# Patient Record
Sex: Female | Born: 1940
Health system: Southern US, Community
[De-identification: ages and names within clinical notes are randomized; demographics above are authoritative.]

## PROBLEM LIST (undated history)

## (undated) DIAGNOSIS — M1990 Unspecified osteoarthritis, unspecified site: Secondary | ICD-10-CM

## (undated) DIAGNOSIS — I4891 Unspecified atrial fibrillation: Secondary | ICD-10-CM

## (undated) DIAGNOSIS — F4310 Post-traumatic stress disorder, unspecified: Secondary | ICD-10-CM

## (undated) DIAGNOSIS — I10 Essential (primary) hypertension: Secondary | ICD-10-CM

## (undated) DIAGNOSIS — E7800 Pure hypercholesterolemia, unspecified: Secondary | ICD-10-CM

## (undated) DIAGNOSIS — E039 Hypothyroidism, unspecified: Secondary | ICD-10-CM

## (undated) HISTORY — DX: Hypothyroidism, unspecified: E03.9

## (undated) HISTORY — PX: ABDOMINAL HYSTERECTOMY: SHX81

## (undated) HISTORY — PX: CARPAL TUNNEL RELEASE: SHX101

## (undated) HISTORY — DX: Post-traumatic stress disorder, unspecified: F43.10

## (undated) HISTORY — DX: Pure hypercholesterolemia, unspecified: E78.00

## (undated) HISTORY — PX: OTHER SURGICAL HISTORY: SHX169

## (undated) HISTORY — DX: Essential (primary) hypertension: I10

## (undated) HISTORY — DX: Unspecified osteoarthritis, unspecified site: M19.90

## (undated) HISTORY — DX: Unspecified atrial fibrillation: I48.91

---

## 2001-07-16 ENCOUNTER — Ambulatory Visit (HOSPITAL_COMMUNITY): Admission: RE | Admit: 2001-07-16 | Discharge: 2001-07-16 | Payer: Self-pay | Admitting: Pulmonary Disease

## 2001-09-20 ENCOUNTER — Ambulatory Visit (HOSPITAL_COMMUNITY): Admission: RE | Admit: 2001-09-20 | Discharge: 2001-09-20 | Payer: Self-pay | Admitting: Pulmonary Disease

## 2001-11-12 ENCOUNTER — Ambulatory Visit (HOSPITAL_COMMUNITY): Admission: RE | Admit: 2001-11-12 | Discharge: 2001-11-12 | Payer: Self-pay | Admitting: Orthopaedic Surgery

## 2001-12-05 ENCOUNTER — Ambulatory Visit (HOSPITAL_COMMUNITY): Admission: RE | Admit: 2001-12-05 | Discharge: 2001-12-05 | Payer: Self-pay | Admitting: Pulmonary Disease

## 2002-12-29 ENCOUNTER — Ambulatory Visit (HOSPITAL_COMMUNITY): Admission: RE | Admit: 2002-12-29 | Discharge: 2002-12-29 | Payer: Self-pay | Admitting: Pulmonary Disease

## 2004-01-05 ENCOUNTER — Ambulatory Visit (HOSPITAL_COMMUNITY): Admission: RE | Admit: 2004-01-05 | Discharge: 2004-01-05 | Payer: Self-pay | Admitting: Pulmonary Disease

## 2005-04-27 ENCOUNTER — Ambulatory Visit (HOSPITAL_COMMUNITY): Admission: RE | Admit: 2005-04-27 | Discharge: 2005-04-27 | Payer: Self-pay | Admitting: Pulmonary Disease

## 2005-05-22 ENCOUNTER — Ambulatory Visit: Payer: Self-pay | Admitting: *Deleted

## 2005-05-23 ENCOUNTER — Ambulatory Visit: Payer: Self-pay | Admitting: Cardiology

## 2005-05-23 ENCOUNTER — Ambulatory Visit (HOSPITAL_COMMUNITY): Admission: RE | Admit: 2005-05-23 | Discharge: 2005-05-24 | Payer: Self-pay | Admitting: *Deleted

## 2005-05-25 ENCOUNTER — Ambulatory Visit: Payer: Self-pay | Admitting: *Deleted

## 2005-06-01 ENCOUNTER — Ambulatory Visit: Payer: Self-pay | Admitting: *Deleted

## 2006-10-23 ENCOUNTER — Ambulatory Visit (HOSPITAL_COMMUNITY): Admission: RE | Admit: 2006-10-23 | Discharge: 2006-10-23 | Payer: Self-pay | Admitting: Pulmonary Disease

## 2007-11-11 ENCOUNTER — Ambulatory Visit (HOSPITAL_COMMUNITY): Admission: RE | Admit: 2007-11-11 | Discharge: 2007-11-11 | Payer: Self-pay | Admitting: Pulmonary Disease

## 2007-11-20 ENCOUNTER — Ambulatory Visit (HOSPITAL_COMMUNITY): Admission: RE | Admit: 2007-11-20 | Discharge: 2007-11-20 | Payer: Self-pay | Admitting: Pulmonary Disease

## 2008-06-15 ENCOUNTER — Ambulatory Visit (HOSPITAL_COMMUNITY): Admission: RE | Admit: 2008-06-15 | Discharge: 2008-06-15 | Payer: Self-pay | Admitting: Pulmonary Disease

## 2008-11-12 ENCOUNTER — Ambulatory Visit (HOSPITAL_COMMUNITY): Admission: RE | Admit: 2008-11-12 | Discharge: 2008-11-12 | Payer: Self-pay | Admitting: Pulmonary Disease

## 2009-11-15 ENCOUNTER — Ambulatory Visit (HOSPITAL_COMMUNITY): Admission: RE | Admit: 2009-11-15 | Discharge: 2009-11-15 | Payer: Self-pay | Admitting: Pulmonary Disease

## 2010-07-14 ENCOUNTER — Ambulatory Visit (HOSPITAL_COMMUNITY)
Admission: RE | Admit: 2010-07-14 | Discharge: 2010-07-14 | Payer: Self-pay | Source: Home / Self Care | Admitting: Pulmonary Disease

## 2010-10-19 LAB — BASIC METABOLIC PANEL
BUN: 14 mg/dL (ref 6–23)
CO2: 28 mEq/L (ref 19–32)
Calcium: 9.3 mg/dL (ref 8.4–10.5)
Chloride: 108 mEq/L (ref 96–112)
Creatinine, Ser: 0.87 mg/dL (ref 0.4–1.2)
GFR calc Af Amer: >60 mL/min (ref >60)
GFR calc non Af Amer: >60 mL/min (ref >60)
Glucose, Bld: 88 mg/dL (ref 70–99)
Potassium: 4.1 mEq/L (ref 3.5–5.1)
Sodium: 143 mEq/L (ref 135–145)

## 2010-10-19 LAB — HEMOGLOBIN AND HEMATOCRIT, BLOOD
HCT: 38.4 % (ref 36.0–46.0)
Hemoglobin: 13.3 g/dL (ref 12.0–15.0)

## 2010-10-21 ENCOUNTER — Other Ambulatory Visit (HOSPITAL_COMMUNITY): Payer: Self-pay | Admitting: Pulmonary Disease

## 2010-10-21 DIAGNOSIS — Z139 Encounter for screening, unspecified: Secondary | ICD-10-CM

## 2010-10-24 ENCOUNTER — Ambulatory Visit (HOSPITAL_COMMUNITY)
Admission: RE | Admit: 2010-10-24 | Discharge: 2010-10-24 | Payer: Self-pay | Source: Home / Self Care | Attending: Ophthalmology | Admitting: Ophthalmology

## 2010-11-03 ENCOUNTER — Ambulatory Visit (HOSPITAL_COMMUNITY)
Admission: RE | Admit: 2010-11-03 | Discharge: 2010-11-03 | Disposition: A | Discharge status: Home / Self Care | Payer: Medicare Other | Attending: Ophthalmology | Admitting: Ophthalmology

## 2010-11-03 DIAGNOSIS — Z79899 Other long term (current) drug therapy: Secondary | ICD-10-CM | POA: Insufficient documentation

## 2010-11-03 DIAGNOSIS — I10 Essential (primary) hypertension: Secondary | ICD-10-CM | POA: Insufficient documentation

## 2010-11-03 DIAGNOSIS — H2510 Age-related nuclear cataract, unspecified eye: Secondary | ICD-10-CM | POA: Insufficient documentation

## 2010-11-17 ENCOUNTER — Ambulatory Visit (HOSPITAL_COMMUNITY)
Admission: RE | Admit: 2010-11-17 | Discharge: 2010-11-17 | Disposition: A | Discharge status: Home / Self Care | Payer: Medicare Other | Source: Ambulatory Visit | Attending: Pulmonary Disease | Admitting: Pulmonary Disease

## 2010-11-17 DIAGNOSIS — Z1231 Encounter for screening mammogram for malignant neoplasm of breast: Secondary | ICD-10-CM | POA: Insufficient documentation

## 2010-11-17 DIAGNOSIS — Z139 Encounter for screening, unspecified: Secondary | ICD-10-CM

## 2011-02-17 NOTE — Procedures (Signed)
Madeline Branch, Madeline Branch                 ACCOUNT NO.:  [id number]   MEDICAL RECORD NO.:  [id number]          PATIENT TYPE:  OUT   LOCATION:  RAD                           FACILITY:  [facility]   PHYSICIAN:  [place] Bing, M.D. [facility] OF BIRTH:  1940-12-27   DATE OF PROCEDURE:  05/23/2005  DATE OF DISCHARGE:                                  ECHOCARDIOGRAM   REFERRING:  Dr. Juanetta Gosling and Dr. Dorethea Clan.   CLINICAL DATA:  A 70 year old woman with hypertension and hyperlipidemia  presenting with syncope.   M-MODE:  Aorta 3.2, left atrium 3.1, septum 1.2, posterior wall 1.3, LV  diastole 3.8, LV systole 2.3.   1.  Technically is difficult and somewhat suboptimal echocardiographic      study.  2.  Normal left atrium, right atrium and right ventricle.  3.  Normal mitral valve; mild annular calcification; trivial regurgitation.  4.  Mild aortic valvular sclerosis; normal aortic root; normal aortic arch.  5.  Normal tricuspid valve; physiologic regurgitation; normal estimated RV      systolic pressure.  6.  Pulmonic valve and proximal pulmonary artery not well imaged; Doppler      signals from the valve are unremarkable.  7.  IVC not well imaged - probably normal.  8.  Normal left ventricular size; borderline hypertrophy; normal regional      and global function.      [place] Bing, M.D. [facility]  Electronically Signed     RR/MEDQ  D:  05/23/2005  T:  05/24/2005  Job:  [id number]

## 2011-02-17 NOTE — Op Note (Signed)
[facility]  Patient:    Madeline Branch, Madeline Branch Visit Number: [id number] MRN: [id number]          Service Type: DSU Location: DAY Attending Physician:  Windle Guard Dictated by:   Darreld Mclean, M.D. Admit Date:  11/12/2001                             Operative Report  PREOPERATIVE DIAGNOSIS:  Carpal tunnel syndrome, left.  POSTOPERATIVE DIAGNOSIS:  Carpal tunnel syndrome, left.  PROCEDURE:  Release of volar carpal ligament, saline neurolysis, epineurotomy, left median nerve.  ANESTHESIA:  Bier block.  SURGEON:  Darreld Mclean, M.D.  ASSISTANT:  Candace Cruise, P.A.-C.  DRAINS:  No drains.  SPLINTS:  Volar plaster splint.  INDICATION:  Patient is a 70 year old female with pain and tenderness in the left wrist with findings consistent with carpal tunnel.  Nerve conduction velocities and EMG proved she has got carpal tunnel in the left wrist.  She did not improved with conservative treatment, wrist splints or anti-inflammatories.  Surgery was recommended.  Risks and imponderables have been discussed preoperatively in detail.  DESCRIPTION OF PROCEDURE:  Patient was placed supine on the operating room table and after Bier block anesthesia was obtained, she was prepped and draped in the usual manner.  Outline for incision was made.  Incision was made proximally, the median nerve identified and Vesseloop placed around the nerve. Volar carpal ligament was then identified and a groove director was then placed within the space.  The volar carpal ligament was then incised. Retinaculum was cut proximally, saline neurolysis carried out, epineurotomy carried out and specimen of volar carpal ligament sent to pathology.  Nerve inspected; no apparent injury.  Wound reapproximation with 3-0 nylon interrupted vertical mattress manner, sterile dressing applied, bulky dressing applied, sheet cotton cut dorsally, volar plaster splint applied, each bandage applied  loosely.  Patient tolerated the procedure well and went to recovery in good condition.  Sponge and needle count correct.  Prescription given for Vicodin ES for pain.  Will see in the office in approximately 10 days to 2 weeks.  For any difficulty, she can contact me through the office or hospital beeper system. Dictated by:   Darreld Mclean, M.D. Attending Physician:  Windle Guard DD:  11/12/01 TD:  11/12/01 Job: [id number]

## 2011-10-17 ENCOUNTER — Other Ambulatory Visit (HOSPITAL_COMMUNITY): Payer: Self-pay | Admitting: Pulmonary Disease

## 2011-10-17 DIAGNOSIS — Z139 Encounter for screening, unspecified: Secondary | ICD-10-CM

## 2011-11-20 ENCOUNTER — Ambulatory Visit (HOSPITAL_COMMUNITY)
Admission: RE | Admit: 2011-11-20 | Discharge: 2011-11-20 | Disposition: A | Discharge status: Home / Self Care | Payer: Medicare Other | Source: Ambulatory Visit | Attending: Pulmonary Disease | Admitting: Pulmonary Disease

## 2011-11-20 DIAGNOSIS — Z1231 Encounter for screening mammogram for malignant neoplasm of breast: Secondary | ICD-10-CM | POA: Insufficient documentation

## 2011-11-20 DIAGNOSIS — Z139 Encounter for screening, unspecified: Secondary | ICD-10-CM

## 2012-07-26 ENCOUNTER — Other Ambulatory Visit (HOSPITAL_COMMUNITY): Payer: Self-pay | Admitting: Pulmonary Disease

## 2012-07-26 ENCOUNTER — Ambulatory Visit (HOSPITAL_COMMUNITY)
Admission: RE | Admit: 2012-07-26 | Discharge: 2012-07-26 | Disposition: A | Discharge status: Home / Self Care | Payer: Medicare Other | Source: Ambulatory Visit | Attending: Pulmonary Disease | Admitting: Pulmonary Disease

## 2012-07-26 DIAGNOSIS — R209 Unspecified disturbances of skin sensation: Secondary | ICD-10-CM | POA: Insufficient documentation

## 2012-07-26 DIAGNOSIS — M542 Cervicalgia: Secondary | ICD-10-CM

## 2012-07-26 DIAGNOSIS — M79609 Pain in unspecified limb: Secondary | ICD-10-CM | POA: Insufficient documentation

## 2012-07-26 DIAGNOSIS — M47812 Spondylosis without myelopathy or radiculopathy, cervical region: Secondary | ICD-10-CM | POA: Insufficient documentation

## 2012-07-26 DIAGNOSIS — M79603 Pain in arm, unspecified: Secondary | ICD-10-CM

## 2012-07-26 DIAGNOSIS — M5030 Other cervical disc degeneration, unspecified cervical region: Secondary | ICD-10-CM | POA: Insufficient documentation

## 2012-11-27 ENCOUNTER — Other Ambulatory Visit (HOSPITAL_COMMUNITY): Payer: Self-pay | Admitting: Pulmonary Disease

## 2012-12-03 ENCOUNTER — Ambulatory Visit (HOSPITAL_COMMUNITY): Payer: Medicare Other

## 2012-12-10 ENCOUNTER — Ambulatory Visit (HOSPITAL_COMMUNITY)
Admission: RE | Admit: 2012-12-10 | Discharge: 2012-12-10 | Disposition: A | Discharge status: Home / Self Care | Payer: Medicare Other | Source: Ambulatory Visit | Attending: Pulmonary Disease | Admitting: Pulmonary Disease

## 2012-12-10 DIAGNOSIS — Z1231 Encounter for screening mammogram for malignant neoplasm of breast: Secondary | ICD-10-CM | POA: Insufficient documentation

## 2012-12-13 ENCOUNTER — Other Ambulatory Visit: Payer: Self-pay | Admitting: Pulmonary Disease

## 2012-12-13 DIAGNOSIS — R928 Other abnormal and inconclusive findings on diagnostic imaging of breast: Secondary | ICD-10-CM

## 2012-12-25 ENCOUNTER — Ambulatory Visit (HOSPITAL_COMMUNITY)
Admission: RE | Admit: 2012-12-25 | Discharge: 2012-12-25 | Disposition: A | Discharge status: Home / Self Care | Payer: Medicare Other | Source: Ambulatory Visit | Attending: Pulmonary Disease | Admitting: Pulmonary Disease

## 2012-12-25 DIAGNOSIS — N6089 Other benign mammary dysplasias of unspecified breast: Secondary | ICD-10-CM | POA: Insufficient documentation

## 2012-12-25 DIAGNOSIS — R928 Other abnormal and inconclusive findings on diagnostic imaging of breast: Secondary | ICD-10-CM | POA: Insufficient documentation

## 2013-12-10 ENCOUNTER — Other Ambulatory Visit (HOSPITAL_COMMUNITY): Payer: Self-pay | Admitting: Pulmonary Disease

## 2013-12-10 DIAGNOSIS — Z1231 Encounter for screening mammogram for malignant neoplasm of breast: Secondary | ICD-10-CM

## 2013-12-29 ENCOUNTER — Ambulatory Visit (HOSPITAL_COMMUNITY)
Admission: RE | Admit: 2013-12-29 | Discharge: 2013-12-29 | Disposition: A | Discharge status: Home / Self Care | Payer: PRIVATE HEALTH INSURANCE | Source: Ambulatory Visit | Attending: Pulmonary Disease | Admitting: Pulmonary Disease

## 2013-12-29 DIAGNOSIS — Z1231 Encounter for screening mammogram for malignant neoplasm of breast: Secondary | ICD-10-CM | POA: Insufficient documentation

## 2014-02-26 ENCOUNTER — Other Ambulatory Visit (HOSPITAL_COMMUNITY): Payer: Self-pay | Admitting: Pulmonary Disease

## 2014-02-26 DIAGNOSIS — R109 Unspecified abdominal pain: Secondary | ICD-10-CM

## 2014-03-02 ENCOUNTER — Encounter: Payer: Self-pay | Admitting: *Deleted

## 2014-03-03 ENCOUNTER — Ambulatory Visit (HOSPITAL_COMMUNITY)
Admission: RE | Admit: 2014-03-03 | Discharge: 2014-03-03 | Disposition: A | Discharge status: Home / Self Care | Payer: PRIVATE HEALTH INSURANCE | Source: Ambulatory Visit | Attending: Pulmonary Disease | Admitting: Pulmonary Disease

## 2014-03-03 DIAGNOSIS — R109 Unspecified abdominal pain: Secondary | ICD-10-CM

## 2014-03-03 DIAGNOSIS — K8020 Calculus of gallbladder without cholecystitis without obstruction: Secondary | ICD-10-CM | POA: Diagnosis not present

## 2014-03-03 DIAGNOSIS — R1013 Epigastric pain: Secondary | ICD-10-CM | POA: Diagnosis present

## 2014-04-01 ENCOUNTER — Encounter (INDEPENDENT_AMBULATORY_CARE_PROVIDER_SITE_OTHER): Payer: Self-pay

## 2014-04-01 ENCOUNTER — Telehealth: Payer: Self-pay | Admitting: Gastroenterology

## 2014-04-01 ENCOUNTER — Ambulatory Visit (INDEPENDENT_AMBULATORY_CARE_PROVIDER_SITE_OTHER): Payer: PRIVATE HEALTH INSURANCE | Admitting: Gastroenterology

## 2014-04-01 ENCOUNTER — Encounter: Payer: Self-pay | Admitting: Gastroenterology

## 2014-04-01 VITALS — BP 116/60 | HR 60 | Temp 97.3°F | Ht 63.0 in | Wt 130.6 lb

## 2014-04-01 DIAGNOSIS — K3189 Other diseases of stomach and duodenum: Secondary | ICD-10-CM

## 2014-04-01 DIAGNOSIS — R195 Other fecal abnormalities: Secondary | ICD-10-CM

## 2014-04-01 DIAGNOSIS — R1310 Dysphagia, unspecified: Secondary | ICD-10-CM

## 2014-04-01 DIAGNOSIS — R1013 Epigastric pain: Secondary | ICD-10-CM

## 2014-04-01 MED ORDER — PEG 3350-KCL-NA BICARB-NACL 420 G PO SOLR: 4000.0000 mL | ORAL | Status: DC

## 2014-04-01 NOTE — Assessment & Plan Note (Addendum)
Onset in January, following what sounds like a viral illness. Negative stool studies through PCP to include stool culture, O&P, Cdiff toxin A/B. Will update with Cdiff PCR.  No prior colonoscopy. Significant change in bowel habits. ?post-infectious IBS, microscopic colitis, less likely malignancy  Proceed with TCS with Dr. Jena Gaussourk in near future: the risks, benefits, and alternatives have been discussed with the patient in detail. The patient states understanding and desires to proceed. Add probiotic daily Cdiff PCR

## 2014-04-01 NOTE — Patient Instructions (Signed)
We have scheduled you for a colonoscopy, upper endoscopy and possible dilation with Dr. Jena Gaussourk in the near future.  Start taking a probiotic daily. This helps to restore "good bacteria" to your gut.  Further recommendations to follow!!

## 2014-04-01 NOTE — Assessment & Plan Note (Signed)
73 year old female with epigastric discomfort and dysphagia X 6 months, with associated weight loss but no melena or typical GERD symptoms. Possible EGD 30+ years ago for similar symptoms. Needs upper GI evaluation via EGD with possible dilation at time of colonoscopy.   Proceed with upper endoscopy/dilation in the near future with Dr. Jena Gaussourk. The risks, benefits, and alternatives have been discussed in detail with patient. They have stated understanding and desire to proceed.  Gallbladder remains in situ, with US showing gallstones. No acute process noted. Consider HIDA if persistent abdominal pain and negative EGD.

## 2014-04-01 NOTE — Progress Notes (Addendum)
Primary Care Physician:  Fredirick Maudlin, MD Primary Gastroenterologist:  Dr.  Jena Gauss   Chief Complaint  Patient presents with  . Diarrhea    Has had diarrhea since January    HPI:   Madeline Branch presents today at the request of Dr. Juanetta Gosling secondary to abdominal pain and diarrhea. States that she has had chronic issues with just little "attacks" here and there of diarrhea but then resolution. States end of January had worsening abdominal pain, persisting for a few weeks. Afebrile. Pain located lower abdomen. Felt like she may have had the flu. Other family members sick at this time. Took about 6 weeks to recover. Diarrhea has persisted since this time. Loose stools vary from 3-5 per day. Sometimes postprandial urgency. Prior to all of this would have a BM once daily. No rectal bleeding. States she lost about 6 lbs since January. Usually weighs in the 136 range. Now 130. States she is always hungry. Abdominal cramping with diarrhea then resolves. No recent antibiotic exposure. [place] water. Lomotil with just mild improvement of diarrhea. Took Bentyl without much improvement. Stool studies negative through PCP.   Also notes new onset epigastric pain since January. Intermittent. While eating/drinking will note substernal discomfort, radiates through to her back. Solid food dysphagia noted. Epigastric burning. No N/V. No melena.   No prior colonoscopy. Possibly remote history of EGD at least 35+ years ago due to dyspepsia.  No routine use of NSAIDs or aspirin powders.   Past Medical History  Diagnosis Date  . Hypercholesterolemia   . Hypertension   . Arthritis   . Hypothyroidism   . Atrial fibrillation   . PTSD (post-traumatic stress disorder)     Past Surgical History  Procedure Laterality Date  . Carpal tunnel release    . Abdominal hysterectomy      Current Outpatient Prescriptions  Medication Sig Dispense Refill  . bisoprolol-hydrochlorothiazide (ZIAC) 5-6.25 MG per  tablet Take 1 tablet by mouth daily.      . diphenoxylate-atropine (LOMOTIL) 2.5-0.025 MG per tablet Take by mouth 4 (four) times daily as needed for diarrhea or loose stools. As directed for diarrhea      . fenofibrate (TRICOR) 145 MG tablet Take 145 mg by mouth daily.      Marland Kitchen levothyroxine (SYNTHROID, LEVOTHROID) 137 MCG tablet Take 137 mcg by mouth daily before breakfast.      . mirabegron ER (MYRBETRIQ) 50 MG TB24 tablet Take 50 mg by mouth daily.      . sertraline (ZOLOFT) 100 MG tablet Take 100 mg by mouth daily. Takes one and one half tablet every morning      . simvastatin (ZOCOR) 40 MG tablet Take 40 mg by mouth daily.       No current facility-administered medications for this visit.    Allergies as of 04/01/2014 - never reviewed  Allergen Reaction Noted  . Asa [aspirin] Anaphylaxis 04/01/2014  . Penicillins Itching 04/01/2014    Family History  Problem Relation Age of Onset  . Colon cancer Neg Hx     History   Social History  . Marital Status: Widowed    Spouse Name: N/A    Number of Children: N/A  . Years of Education: N/A   Occupational History  . Not on file.   Social History Main Topics  . Smoking status: Current Some Day Smoker -- 1.00 packs/day    Types: Cigarettes  . Smokeless tobacco: Not on file  Comment: Smokes one pack of cigarettes daily  . Alcohol Use: No  . Drug Use: No  . Sexual Activity: Not on file   Other Topics Concern  . Not on file   Social History Narrative  . No narrative on file    Review of Systems: Gen: see HPI CV: seldom palpitations Resp: smoker's cough GI: see HPI GU : Denies urinary burning, urinary frequency, urinary hesitancy MS: arthritis (hands) Derm: Denies rash, itching, dry skin Psych: PTSD Heme: Denies bruising, bleeding, and enlarged lymph nodes.  Physical Exam: BP 116/60  Pulse 60  Temp(Src) 97.3 F (36.3 C) (Oral)  Ht 5\' 3"  (1.6 m)  Wt 130 lb 9.6 oz (59.24 kg)  BMI 23.14 kg/m2 General:   Alert  and oriented. Pleasant and cooperative. Well-nourished and well-developed.  Head:  Normocephalic and atraumatic. Eyes:  Without icterus, sclera clear and conjunctiva pink.  Ears:  Normal auditory acuity. Nose:  No deformity, discharge,  or lesions. Mouth:  No deformity or lesions, oral mucosa pink. edentulous Lungs:  Clear to auscultation bilaterally. No wheezes, rales, or rhonchi. No distress.  Heart:  S1, S2 present  Abdomen:  +BS, soft, mild TTP epigastric region and non-distended. No HSM noted. No guarding or rebound. No masses appreciated.  Rectal:  Deferred  Msk:  Symmetrical without gross deformities. Normal posture. Extremities:  Without clubbing or edema. Neurologic:  Alert and  oriented x4;  grossly normal neurologically. Skin:  Intact without significant lesions or rashes. Psych:  Alert and cooperative. Normal mood and affect.   Outside stool studies: Cdiff A/B negative. Culture negative. O& P negative.

## 2014-04-01 NOTE — Telephone Encounter (Signed)
Let's grab a Cdiff PCR on patient. She had Cdiff toxin A/B checked, but PCR is more accurate.   Also, I called to see if she takes an NSAIDs, aspirin powders. I need to document that in her chart.

## 2014-04-01 NOTE — Progress Notes (Signed)
cc'd to pcp 

## 2014-04-01 NOTE — Assessment & Plan Note (Signed)
For 6 months. Dilation as appropriate. Query esophagitis, web, ring, stricture. Less likely malignancy.

## 2014-04-02 ENCOUNTER — Other Ambulatory Visit: Payer: Self-pay

## 2014-04-02 DIAGNOSIS — R195 Other fecal abnormalities: Secondary | ICD-10-CM

## 2014-04-02 NOTE — Telephone Encounter (Signed)
Pt aware, she will pick up container and orders on Monday. Will have them up front for her.She doesn't take any type of aspirin. She occasionally will take ibuprofen or tylenol for a headache. She said usually one dose is all it takes for her. She does not take it regularly

## 2014-04-15 LAB — CLOSTRIDIUM DIFFICILE BY PCR: CDIFFPCR: NOT DETECTED

## 2014-04-17 ENCOUNTER — Encounter (HOSPITAL_COMMUNITY): Payer: Self-pay | Admitting: Pharmacy Technician

## 2014-04-22 NOTE — Progress Notes (Signed)
Quick Note:  Cdiff PCR negative. Proceed with evaluation as planned. ______

## 2014-04-23 ENCOUNTER — Encounter (HOSPITAL_COMMUNITY): Payer: Self-pay | Admitting: *Deleted

## 2014-04-23 ENCOUNTER — Ambulatory Visit (HOSPITAL_COMMUNITY)
Admission: RE | Admit: 2014-04-23 | Discharge: 2014-04-23 | Disposition: A | Discharge status: Home / Self Care | Payer: PRIVATE HEALTH INSURANCE | Source: Ambulatory Visit | Attending: Internal Medicine | Admitting: Internal Medicine

## 2014-04-23 ENCOUNTER — Encounter (HOSPITAL_COMMUNITY)
Admission: RE | Disposition: A | Discharge status: Home / Self Care | Payer: Self-pay | Source: Ambulatory Visit | Attending: Internal Medicine

## 2014-04-23 DIAGNOSIS — K3189 Other diseases of stomach and duodenum: Secondary | ICD-10-CM | POA: Insufficient documentation

## 2014-04-23 DIAGNOSIS — I4891 Unspecified atrial fibrillation: Secondary | ICD-10-CM | POA: Diagnosis not present

## 2014-04-23 DIAGNOSIS — R197 Diarrhea, unspecified: Secondary | ICD-10-CM | POA: Diagnosis present

## 2014-04-23 DIAGNOSIS — E039 Hypothyroidism, unspecified: Secondary | ICD-10-CM | POA: Insufficient documentation

## 2014-04-23 DIAGNOSIS — F430 Acute stress reaction: Secondary | ICD-10-CM | POA: Diagnosis not present

## 2014-04-23 DIAGNOSIS — Z79899 Other long term (current) drug therapy: Secondary | ICD-10-CM | POA: Diagnosis not present

## 2014-04-23 DIAGNOSIS — K2940 Chronic atrophic gastritis without bleeding: Secondary | ICD-10-CM | POA: Diagnosis not present

## 2014-04-23 DIAGNOSIS — K269 Duodenal ulcer, unspecified as acute or chronic, without hemorrhage or perforation: Secondary | ICD-10-CM | POA: Diagnosis not present

## 2014-04-23 DIAGNOSIS — R1013 Epigastric pain: Secondary | ICD-10-CM

## 2014-04-23 DIAGNOSIS — R1310 Dysphagia, unspecified: Secondary | ICD-10-CM | POA: Diagnosis not present

## 2014-04-23 DIAGNOSIS — R195 Other fecal abnormalities: Secondary | ICD-10-CM

## 2014-04-23 DIAGNOSIS — I10 Essential (primary) hypertension: Secondary | ICD-10-CM | POA: Insufficient documentation

## 2014-04-23 DIAGNOSIS — R109 Unspecified abdominal pain: Secondary | ICD-10-CM | POA: Insufficient documentation

## 2014-04-23 DIAGNOSIS — E7800 Pure hypercholesterolemia, unspecified: Secondary | ICD-10-CM | POA: Diagnosis not present

## 2014-04-23 HISTORY — PX: ESOPHAGOGASTRODUODENOSCOPY: SHX5428

## 2014-04-23 HISTORY — PX: COLONOSCOPY: SHX5424

## 2014-04-23 HISTORY — PX: MALONEY DILATION: SHX5535

## 2014-04-23 HISTORY — PX: SAVORY DILATION: SHX5439

## 2014-04-23 SURGERY — COLONOSCOPY
Anesthesia: Moderate Sedation

## 2014-04-23 MED ORDER — LIDOCAINE VISCOUS 2 % MT SOLN
OROMUCOSAL | Status: DC | PRN
  Administered 2014-04-23: 3 mL via OROMUCOSAL

## 2014-04-23 MED ORDER — LIDOCAINE VISCOUS 2 % MT SOLN
OROMUCOSAL | Status: AC
  Filled 2014-04-23: qty 15

## 2014-04-23 MED ORDER — MEPERIDINE HCL 100 MG/ML IJ SOLN
INTRAMUSCULAR | Status: AC
  Filled 2014-04-23: qty 2

## 2014-04-23 MED ORDER — STERILE WATER FOR IRRIGATION IR SOLN
Status: DC | PRN
  Administered 2014-04-23: 10:00:00

## 2014-04-23 MED ORDER — ONDANSETRON HCL 4 MG/2ML IJ SOLN
INTRAMUSCULAR | Status: DC | PRN
  Administered 2014-04-23: 4 mg via INTRAVENOUS

## 2014-04-23 MED ORDER — SODIUM CHLORIDE 0.9 % IV SOLN
INTRAVENOUS | Status: DC
  Administered 2014-04-23: 10:00:00 via INTRAVENOUS

## 2014-04-23 MED ORDER — MIDAZOLAM HCL 5 MG/5ML IJ SOLN
INTRAMUSCULAR | Status: DC | PRN
  Administered 2014-04-23 (×2): 1 mg via INTRAVENOUS
  Administered 2014-04-23: 2 mg via INTRAVENOUS
  Administered 2014-04-23: 1 mg via INTRAVENOUS

## 2014-04-23 MED ORDER — MEPERIDINE HCL 100 MG/ML IJ SOLN
INTRAMUSCULAR | Status: DC | PRN
  Administered 2014-04-23: 25 mg via INTRAVENOUS
  Administered 2014-04-23: 50 mg via INTRAVENOUS
  Administered 2014-04-23: 25 mg via INTRAVENOUS

## 2014-04-23 MED ORDER — ONDANSETRON HCL 4 MG/2ML IJ SOLN
INTRAMUSCULAR | Status: AC
  Filled 2014-04-23: qty 2

## 2014-04-23 MED ORDER — MIDAZOLAM HCL 5 MG/5ML IJ SOLN
INTRAMUSCULAR | Status: AC
  Filled 2014-04-23: qty 10

## 2014-04-23 NOTE — Interval H&P Note (Signed)
History and Physical Interval Note:  04/23/2014 10:16 AM  Madeline Branch  has presented today for surgery, with the diagnosis of DYSPEPSIA, DYSPHAGIA , DIARRHEA  The various methods of treatment have been discussed with the patient and family. After consideration of risks, benefits and other options for treatment, the patient has consented to  Procedure(s) with comments: COLONOSCOPY (N/A) - 10:00 ESOPHAGOGASTRODUODENOSCOPY (EGD) (N/A) - 10:00 SAVORY DILATION (N/A) MALONEY DILATION (N/A) as a surgical intervention .  The patient's history has been reviewed, patient examined, no change in status, stable for surgery.  I have reviewed the patient's chart and labs.  Questions were answered to the patient's satisfaction.     Madeline Branch  No change. EGD with potential esophageal dilation and colonoscopy per plan.  The risks, benefits, limitations, imponderables and alternatives regarding both EGD and colonoscopy have been reviewed with the patient. Questions have been answered. All parties agreeable.

## 2014-04-23 NOTE — Op Note (Signed)
[facility] [address] [place], [id number]   COLONOSCOPY PROCEDURE REPORT  PATIENT: Madeline Branch, Madeline M.  MR#:         [id number] BIRTHDATE: 1941/05/12 , 73  yrs. old GENDER: Female ENDOSCOPIST: R.  Roetta SessionsMichael Lakecia Deschamps, MD FACP [facility] REFERRED BY:  Kari BaarsEdward Hawkins, BranchD. PROCEDURE DATE:  04/23/2014 PROCEDURE:     Ileocolonoscopy with segmental biopsy  INDICATIONS: Chronic diarrhea  INFORMED CONSENT:  The risks, benefits, alternatives and imponderables including but not limited to bleeding, perforation as well as the possibility of a missed lesion have been reviewed.  The potential for biopsy, lesion removal, etc. have also been discussed.  Questions have been answered.  All parties agreeable. Please see the history and physical in the medical record for more information.  MEDICATIONS: Versed 5 mg IV and Demerol 100 mg IV in divided doses. Zofran 4 mg IV.  DESCRIPTION OF PROCEDURE:  After a digital rectal exam was performed, the EC-[id number] ([id number]  colonoscope was advanced from the anus through the rectum and colon to the area of the cecum, ileocecal valve and appendiceal orifice.  The cecum was deeply intubated.  These structures were well-seen and photographed for the record.  From the level of the cecum and ileocecal valve, the scope was slowly and cautiously withdrawn.  The mucosal surfaces were carefully surveyed utilizing scope tip deflection to facilitate fold flattening as needed.  The scope was pulled down into the rectum where a thorough examination including retroflexion was performed.    FINDINGS:  Adequate preparation. Normal rectum. Normal-appearing colonic mucosa appeared Normal-appearing distal 5 cm of terminal ileal mucosa.  THERAPEUTIC / DIAGNOSTIC MANEUVERS PERFORMED:  signal biopsies of the ascending, descending/sigmoid segments taken to assess for microscopic colitis.  COMPLICATIONS: none  CECAL WITHDRAWAL TIME:  9 minutes  IMPRESSION:   Normal ileocolonoscopy-status post segmental biopsy  RECOMMENDATIONS: Followup on pathology report. See EGD.   _______________________________ eSigned:  R. Roetta SessionsMichael Tatem Fesler, MD FACP [facility] 04/23/2014 11:00 AM   CC:    PATIENT NAME:  Madeline Branch, Madeline M. MR#: [id number]

## 2014-04-23 NOTE — Discharge Instructions (Signed)
°Colonoscopy °Discharge Instructions ° °Read the instructions outlined below and refer to this sheet in the next few weeks. These discharge instructions provide you with general information on caring for yourself after you leave the hospital. Your doctor may also give you specific instructions. While your treatment has been planned according to the most current medical practices available, unavoidable complications occasionally occur. If you have any problems or questions after discharge, call Dr. Clark Clowdus at [phone]. °ACTIVITY °· You may resume your regular activity, but move at a slower pace for the next 24 hours.  °· Take frequent rest periods for the next 24 hours.  °· Walking will help get rid of the air and reduce the bloated feeling in your belly (abdomen).  °· No driving for 24 hours (because of the medicine (anesthesia) used during the test).   °· Do not sign any important legal documents or operate any machinery for 24 hours (because of the anesthesia used during the test).  °NUTRITION °· Drink plenty of fluids.  °· You may resume your normal diet as instructed by your doctor.  °· Begin with a light meal and progress to your normal diet. Heavy or fried foods are harder to digest and may make you feel sick to your stomach (nauseated).  °· Avoid alcoholic beverages for 24 hours or as instructed.  °MEDICATIONS °· You may resume your normal medications unless your doctor tells you otherwise.  °WHAT YOU CAN EXPECT TODAY °· Some feelings of bloating in the abdomen.  °· Passage of more gas than usual.  °· Spotting of blood in your stool or on the toilet paper.  °IF YOU HAD POLYPS REMOVED DURING THE COLONOSCOPY: °· No aspirin products for 7 days or as instructed.  °· No alcohol for 7 days or as instructed.  °· Eat a soft diet for the next 24 hours.  °FINDING OUT THE RESULTS OF YOUR TEST °Not all test results are available during your visit. If your test results are not back during the visit, make an appointment  with your caregiver to find out the results. Do not assume everything is normal if you have not heard from your caregiver or the medical facility. It is important for you to follow up on all of your test results.  °SEEK IMMEDIATE MEDICAL ATTENTION IF: °· You have more than a spotting of blood in your stool.  °· Your belly is swollen (abdominal distention).  °· You are nauseated or vomiting.  °· You have a temperature over 101.  °· You have abdominal pain or discomfort that is severe or gets worse throughout the day.  ° ° °EGD °Discharge instructions °Please read the instructions outlined below and refer to this sheet in the next few weeks. These discharge instructions provide you with general information on caring for yourself after you leave the hospital. Your doctor may also give you specific instructions. While your treatment has been planned according to the most current medical practices available, unavoidable complications occasionally occur. If you have any problems or questions after discharge, please call your doctor. °ACTIVITY °· You may resume your regular activity but move at a slower pace for the next 24 hours.  °· Take frequent rest periods for the next 24 hours.  °· Walking will help expel (get rid of) the air and reduce the bloated feeling in your abdomen.  °· No driving for 24 hours (because of the anesthesia (medicine) used during the test).  °· You may shower.  °· Do not sign   any important legal documents or operate any machinery for 24 hours (because of the anesthesia used during the test).  °NUTRITION °· Drink plenty of fluids.  °· You may resume your normal diet.  °· Begin with a light meal and progress to your normal diet.  °· Avoid alcoholic beverages for 24 hours or as instructed by your caregiver.  °MEDICATIONS °· You may resume your normal medications unless your caregiver tells you otherwise.  °WHAT YOU CAN EXPECT TODAY °· You may experience abdominal discomfort such as a feeling of  fullness or “gas” pains.  °FOLLOW-UP °· Your doctor will discuss the results of your test with you.  °SEEK IMMEDIATE MEDICAL ATTENTION IF ANY OF THE FOLLOWING OCCUR: °· Excessive nausea (feeling sick to your stomach) and/or vomiting.  °· Severe abdominal pain and distention (swelling).  °· Trouble swallowing.  °· Temperature over 101° F (37.8º C).  °· Rectal bleeding or vomiting of blood.  ° ° °Further recommendations to follow pending review of pathology report ° °

## 2014-04-23 NOTE — H&P (View-Only) (Signed)
Primary Care Physician:  Fredirick Maudlin, MD Primary Gastroenterologist:  Dr.  Jena Gauss   Chief Complaint  Patient presents with  . Diarrhea    Has had diarrhea since January    HPI:   Madeline Branch presents today at the request of Dr. Juanetta Gosling secondary to abdominal pain and diarrhea. States that she has had chronic issues with just little "attacks" here and there of diarrhea but then resolution. States end of January had worsening abdominal pain, persisting for a few weeks. Afebrile. Pain located lower abdomen. Felt like she may have had the flu. Other family members sick at this time. Took about 6 weeks to recover. Diarrhea has persisted since this time. Loose stools vary from 3-5 per day. Sometimes postprandial urgency. Prior to all of this would have a BM once daily. No rectal bleeding. States she lost about 6 lbs since January. Usually weighs in the 136 range. Now 130. States she is always hungry. Abdominal cramping with diarrhea then resolves. No recent antibiotic exposure. [place] water. Lomotil with just mild improvement of diarrhea. Took Bentyl without much improvement. Stool studies negative through PCP.   Also notes new onset epigastric pain since January. Intermittent. While eating/drinking will note substernal discomfort, radiates through to her back. Solid food dysphagia noted. Epigastric burning. No N/V. No melena.   No prior colonoscopy. Possibly remote history of EGD at least 35+ years ago due to dyspepsia.  No routine use of NSAIDs or aspirin powders.   Past Medical History  Diagnosis Date  . Hypercholesterolemia   . Hypertension   . Arthritis   . Hypothyroidism   . Atrial fibrillation   . PTSD (post-traumatic stress disorder)     Past Surgical History  Procedure Laterality Date  . Carpal tunnel release    . Abdominal hysterectomy      Current Outpatient Prescriptions  Medication Sig Dispense Refill  . bisoprolol-hydrochlorothiazide (ZIAC) 5-6.25 MG per  tablet Take 1 tablet by mouth daily.      . diphenoxylate-atropine (LOMOTIL) 2.5-0.025 MG per tablet Take by mouth 4 (four) times daily as needed for diarrhea or loose stools. As directed for diarrhea      . fenofibrate (TRICOR) 145 MG tablet Take 145 mg by mouth daily.      Marland Kitchen levothyroxine (SYNTHROID, LEVOTHROID) 137 MCG tablet Take 137 mcg by mouth daily before breakfast.      . mirabegron ER (MYRBETRIQ) 50 MG TB24 tablet Take 50 mg by mouth daily.      . sertraline (ZOLOFT) 100 MG tablet Take 100 mg by mouth daily. Takes one and one half tablet every morning      . simvastatin (ZOCOR) 40 MG tablet Take 40 mg by mouth daily.       No current facility-administered medications for this visit.    Allergies as of 04/01/2014 - never reviewed  Allergen Reaction Noted  . Asa [aspirin] Anaphylaxis 04/01/2014  . Penicillins Itching 04/01/2014    Family History  Problem Relation Age of Onset  . Colon cancer Neg Hx     History   Social History  . Marital Status: Widowed    Spouse Name: N/A    Number of Children: N/A  . Years of Education: N/A   Occupational History  . Not on file.   Social History Main Topics  . Smoking status: Current Some Day Smoker -- 1.00 packs/day    Types: Cigarettes  . Smokeless tobacco: Not on file  Comment: Smokes one pack of cigarettes daily  . Alcohol Use: No  . Drug Use: No  . Sexual Activity: Not on file   Other Topics Concern  . Not on file   Social History Narrative  . No narrative on file    Review of Systems: Gen: see HPI CV: seldom palpitations Resp: smoker's cough GI: see HPI GU : Denies urinary burning, urinary frequency, urinary hesitancy MS: arthritis (hands) Derm: Denies rash, itching, dry skin Psych: PTSD Heme: Denies bruising, bleeding, and enlarged lymph nodes.  Physical Exam: BP 116/60  Pulse 60  Temp(Src) 97.3 F (36.3 C) (Oral)  Ht 5\' 3"  (1.6 m)  Wt 130 lb 9.6 oz (59.24 kg)  BMI 23.14 kg/m2 General:   Alert  and oriented. Pleasant and cooperative. Well-nourished and well-developed.  Head:  Normocephalic and atraumatic. Eyes:  Without icterus, sclera clear and conjunctiva pink.  Ears:  Normal auditory acuity. Nose:  No deformity, discharge,  or lesions. Mouth:  No deformity or lesions, oral mucosa pink. edentulous Lungs:  Clear to auscultation bilaterally. No wheezes, rales, or rhonchi. No distress.  Heart:  S1, S2 present  Abdomen:  +BS, soft, mild TTP epigastric region and non-distended. No HSM noted. No guarding or rebound. No masses appreciated.  Rectal:  Deferred  Msk:  Symmetrical without gross deformities. Normal posture. Extremities:  Without clubbing or edema. Neurologic:  Alert and  oriented x4;  grossly normal neurologically. Skin:  Intact without significant lesions or rashes. Psych:  Alert and cooperative. Normal mood and affect.   Outside stool studies: Cdiff A/B negative. Culture negative. O& P negative.

## 2014-04-23 NOTE — Op Note (Signed)
[facility] [address] [place], [id number]   ENDOSCOPY PROCEDURE REPORT  PATIENT: Madeline Branch, Madeline M.  MR#: [id number] BIRTHDATE: [birth year] , 73  yrs. old GENDER: Female ENDOSCOPIST: R.  Roetta SessionsMichael Mckade Gurka, MD FACP [facility] REFERRED BY:  Kari BaarsEdward Hawkins, BranchD. PROCEDURE DATE:  04/23/2014 PROCEDURE:     EGD with Elease HashimotoMaloney dilation followed by gastric biopsy  INDICATIONS:     Dyspepsia/esophageal dysphagia  INFORMED CONSENT:   The risks, benefits, limitations, alternatives and imponderables have been discussed.  The potential for biopsy, esophogeal dilation, etc. have also been reviewed.  Questions have been answered.  All parties agreeable.  Please see the history and physical in the medical record for more information.  MEDICATIONS:   Versed 3 mg IV and Demerol 75 mg IV in divided doses. Xylocaine gel orally. Zofran 4 mg IV.  DESCRIPTION OF PROCEDURE:   The EG-[id number] ([id number]  endoscope was introduced through the mouth and advanced to the second portion of the duodenum without difficulty or limitations.  The mucosal surfaces were surveyed very carefully during advancement of the scope and upon withdrawal.  Retroflexion view of the proximal stomach and esophagogastric junction was performed.      FINDINGS:  Normal appearing tubular esophagus. Stomach empty. Normal-appearing gastric mucosa. Patent pylorus. Examination bulb and second portion revealed scattered bulbar erosions.  THERAPEUTIC / DIAGNOSTIC MANEUVERS PERFORMED:  A 54 French Maloney dilator was passed to full insertion easily. A look back revealed no apparent complication related to this maneuver.   Biopsies of the gastric mucosa taken to assess for Helicobacter pylori.   COMPLICATIONS:  None  IMPRESSION:    Normal esophagus-status post passage of a Maloney dilator. Duodenal bulbar erosions-status post gastric biopsy  RECOMMENDATIONS:   Followup on pathology. See colonoscopy  report.    _______________________________ R. Roetta SessionsMichael Marguerette Sheller, MD FACP [facility] eSigned:  R. Roetta SessionsMichael Karisha Marlin, MD FACP [facility] 04/23/2014 10:39 AM     CC:  PATIENT NAME:  Madeline Branch, Madeline M. MR#: [id number]

## 2014-04-24 ENCOUNTER — Encounter (HOSPITAL_COMMUNITY): Payer: Self-pay | Admitting: Internal Medicine

## 2014-04-24 ENCOUNTER — Encounter: Payer: Self-pay | Admitting: Internal Medicine

## 2014-04-28 ENCOUNTER — Telehealth: Payer: Self-pay

## 2014-04-28 NOTE — Telephone Encounter (Signed)
Letter from: Corbin Adeourk, Robert M  Reason for Letter: Results Review  Send letter to patient.  Send copy of letter with path to referring provider and PCP.   Need ov w AS; if no better, may need to consider HIDA with fatty meal challenge. Ongoing diarrhea likely a separate issue.

## 2014-04-29 ENCOUNTER — Encounter: Payer: Self-pay | Admitting: Gastroenterology

## 2014-04-29 NOTE — Telephone Encounter (Signed)
Pt is aware of OV on 9/2 at 11 with AS and appt card mailed

## 2014-04-29 NOTE — Telephone Encounter (Signed)
Letter mailed to pt.  Madeline Branch, please schedule ov with AS

## 2014-06-03 ENCOUNTER — Encounter (INDEPENDENT_AMBULATORY_CARE_PROVIDER_SITE_OTHER): Payer: Self-pay

## 2014-06-03 ENCOUNTER — Ambulatory Visit (INDEPENDENT_AMBULATORY_CARE_PROVIDER_SITE_OTHER): Payer: PRIVATE HEALTH INSURANCE | Admitting: Gastroenterology

## 2014-06-03 ENCOUNTER — Encounter: Payer: Self-pay | Admitting: Gastroenterology

## 2014-06-03 VITALS — BP 107/64 | HR 56 | Temp 97.8°F | Ht 63.0 in | Wt 130.0 lb

## 2014-06-03 DIAGNOSIS — K219 Gastro-esophageal reflux disease without esophagitis: Secondary | ICD-10-CM | POA: Insufficient documentation

## 2014-06-03 DIAGNOSIS — R195 Other fecal abnormalities: Secondary | ICD-10-CM

## 2014-06-03 MED ORDER — DICYCLOMINE HCL 10 MG PO CAPS: 10.0000 mg | ORAL_CAPSULE | Freq: Three times a day (TID) | ORAL | Status: DC

## 2014-06-03 MED ORDER — PANTOPRAZOLE SODIUM 40 MG PO TBEC: 40.0000 mg | DELAYED_RELEASE_TABLET | Freq: Every day | ORAL | Status: DC

## 2014-06-03 NOTE — Patient Instructions (Signed)
Stop Lomotil. Start taking Bentyl 1 capsule with meals and at bedtime. Watch for dry mouth, constipation, dizziness. Call me in 1 week with an update.  Take Protonix for reflux daily, 30 minutes before breakfast for 3 months.  You may take probiotics such as Digestive Advantage, Philip's Colon Health, [employer] brand, Align or Restora.

## 2014-06-03 NOTE — Progress Notes (Signed)
Referring Provider: Fredirick Maudlin, MD Primary Care Physician:  Fredirick Maudlin, MD Primary GI: Dr. Jena Gauss  Chief Complaint  Patient presents with  . Follow-up    HPI:   Madeline Branch presents today in follow-up after TCS/EGD/ED. Chronic gastritis noted, empiric dilation, colonoscopy unrevealing. Dyspepsia noted X 6 months. [place] of abdomen with gallstones.  Dysphagia resolved. No N/V. Diarrhea not as bad. Used to have diarrhea constantly, now just "frequently". Postprandial component. Will have a tinge and then have to run to the bathroom. Used to fluctuate between 137 to 138, now hovering at 130.   Past Medical History  Diagnosis Date  . Hypercholesterolemia   . Hypertension   . Arthritis   . Hypothyroidism   . Atrial fibrillation   . PTSD (post-traumatic stress disorder)     Past Surgical History  Procedure Laterality Date  . Carpal tunnel release    . Abdominal hysterectomy    . Cataracts    . Colonoscopy N/A 04/23/2014    Dr. Rourk:Normal ileocolonoscopy-status post segmental biopsy. Normal path.   . Esophagogastroduodenoscopy N/A 04/23/2014    Dr. Rourk:Normal esophagus-status post passage of a [facility] dilator. Duodenal bulbar erosions-status post biopsy. Mild chronic inactive gastritis  . Savory dilation N/A 04/23/2014    Procedure: SAVORY DILATION;  Surgeon: Corbin Ade, MD;  [facility];  Service: Endoscopy;  Laterality: N/A;  Elease Hashimoto dilation N/A 04/23/2014    Procedure: Elease Hashimoto DILATION;  Surgeon: Corbin Ade, MD;  [facility];  Service: Endoscopy;  Laterality: N/A;    Current Outpatient Prescriptions  Medication Sig Dispense Refill  . bisoprolol-hydrochlorothiazide (ZIAC) 5-6.25 MG per tablet Take 1 tablet by mouth daily.      . diphenoxylate-atropine (LOMOTIL) 2.5-0.025 MG per tablet Take by mouth 4 (four) times daily as needed for diarrhea or loose stools. As directed for diarrhea      . fenofibrate (TRICOR) 145 MG tablet Take  145 mg by mouth daily.      Marland Kitchen levothyroxine (SYNTHROID, LEVOTHROID) 137 MCG tablet Take 137 mcg by mouth daily before breakfast.      . mirabegron ER (MYRBETRIQ) 50 MG TB24 tablet Take 50 mg by mouth daily.      . sertraline (ZOLOFT) 100 MG tablet Take 100 mg by mouth daily. Takes one and one half tablet every morning      . simvastatin (ZOCOR) 40 MG tablet Take 40 mg by mouth daily.      Marland Kitchen dicyclomine (BENTYL) 10 MG capsule Take 1 capsule (10 mg total) by mouth 4 (four) times daily -  before meals and at bedtime.  120 capsule  3  . pantoprazole (PROTONIX) 40 MG tablet Take 1 tablet (40 mg total) by mouth daily. Take 30 minutes prior to breakfast.  90 tablet  3   No current facility-administered medications for this visit.    Allergies as of 06/03/2014 - Review Complete 06/03/2014  Allergen Reaction Noted  . Asa [aspirin] Anaphylaxis 04/01/2014  . Penicillins Itching 04/01/2014    Family History  Problem Relation Age of Onset  . Colon cancer Neg Hx     History   Social History  . Marital Status: Widowed    Spouse Name: N/A    Number of Children: N/A  . Years of Education: N/A   Social History Main Topics  . Smoking status: Current Some Day Smoker -- 1.00 packs/day    Types: Cigarettes  . Smokeless tobacco: Never Used  Comment: Smokes one pack of cigarettes daily  . Alcohol Use: No  . Drug Use: No  . Sexual Activity: Not Currently   Other Topics Concern  . None   Social History Narrative  . None    Review of Systems: As mentioned in HPI  Physical Exam: BP 107/64  Pulse 56  Temp(Src) 97.8 F (36.6 C) (Oral)  Ht  (1.6 m)  Wt 130 lb (58.968 kg)  BMI 23.03 kg/m2 General:   Alert and oriented. No distress noted. Pleasant and cooperative.  Head:  Normocephalic and atraumatic. Eyes:  Conjuctiva clear without scleral icterus. Mouth:  Oral mucosa pink and moist. Good dentition. No lesions.. Abdomen:  +BS, soft, non-tender and non-distended. No rebound or  guarding. No HSM or masses noted. Msk:  Symmetrical without gross deformities. Normal posture. Extremities:  Without edema. Neurologic:  Alert and  oriented x4;  grossly normal neurologically. Psych:  Alert and cooperative. Normal mood and affect.

## 2014-06-08 NOTE — Assessment & Plan Note (Signed)
Dysphagia resolved after empiric dilation. Start Protonix. Wants to hold off on HIDA scan currently for dyspepsia. [place] of abdomen with gallstones. HIDA if persistent. EGD with chronic gastritis.

## 2014-06-08 NOTE — Assessment & Plan Note (Signed)
Negative colonoscopy. Trial of Bentyl. Start a probiotic. Progress report in 1 week.

## 2014-06-09 NOTE — Progress Notes (Signed)
Cc to pcp °

## 2014-06-10 ENCOUNTER — Telehealth: Payer: Self-pay

## 2014-06-10 ENCOUNTER — Encounter: Payer: Self-pay | Admitting: Internal Medicine

## 2014-06-10 NOTE — Telephone Encounter (Signed)
Please schedule ov.  

## 2014-06-10 NOTE — Telephone Encounter (Signed)
Pt called with a progress report. She said she is doing much better. She is taking the bentyl and has not had to take any lomotil at all. Her stools are much more formed than before. She said she is feeling much better and has had no pain.

## 2014-06-10 NOTE — Telephone Encounter (Signed)
I am so glad to hear this!!! Let's have her return in 3 months.

## 2014-06-10 NOTE — Telephone Encounter (Signed)
APPT MADE AND LETTER SENT  °

## 2014-09-09 ENCOUNTER — Ambulatory Visit (INDEPENDENT_AMBULATORY_CARE_PROVIDER_SITE_OTHER): Payer: PRIVATE HEALTH INSURANCE | Admitting: Gastroenterology

## 2014-09-09 ENCOUNTER — Encounter: Payer: Self-pay | Admitting: Gastroenterology

## 2014-09-09 VITALS — BP 126/73 | HR 54 | Temp 96.6°F | Ht 63.0 in | Wt 125.2 lb

## 2014-09-09 DIAGNOSIS — R634 Abnormal weight loss: Secondary | ICD-10-CM | POA: Insufficient documentation

## 2014-09-09 DIAGNOSIS — R195 Other fecal abnormalities: Secondary | ICD-10-CM

## 2014-09-09 NOTE — Assessment & Plan Note (Signed)
Improved with Bentyl. Colonoscopy up-to-date. Continue Bentyl. With weight loss, unable to exclude a malabsorptive component. CT ordered due to persistent weight loss. Further recommendations to follow.

## 2014-09-09 NOTE — Progress Notes (Signed)
cc'ed to pcp °

## 2014-09-09 NOTE — Progress Notes (Signed)
Referring Provider: Fredirick MaudlinHawkins, Edward L, MD Primary Care Physician:  Fredirick MaudlinHAWKINS,EDWARD L, MD  Primary GI: Dr. Jena Gaussourk   Chief Complaint  Patient presents with  . Follow-up    HPI:   Madeline CrownKaren M Krawiec presents today in follow-up for intermittent loose stool with negative colonoscopy on file. EGD/TCS up to date.  Dyspepsia noted at least 6 months. US of abdomen with gallstones. Wanted to hold off on HIDA at last visit.  Weight loss noted: now 125. Sept was 130. States used to weigh around 137 range.   Fruits and veggies go right through her. Stool 1-3 times per day but not diarrhea. Formed at times. Dyspepsia resolved. Always hungry. Eating 1 meal a day. Food is expensive. Power bill late. Unable to afford many groceries.    Past Medical History  Diagnosis Date  . Hypercholesterolemia   . Hypertension   . Arthritis   . Hypothyroidism   . Atrial fibrillation   . PTSD (post-traumatic stress disorder)     Past Surgical History  Procedure Laterality Date  . Carpal tunnel release    . Abdominal hysterectomy    . Cataracts    . Colonoscopy N/A 04/23/2014    Dr. Rourk:Normal ileocolonoscopy-status post segmental biopsy. Normal path.   . Esophagogastroduodenoscopy N/A 04/23/2014    Dr. Rourk:Normal esophagus-status post passage of a [facility] dilator. Duodenal bulbar erosions-status post biopsy. Mild chronic inactive gastritis  . Savory dilation N/A 04/23/2014    Procedure: SAVORY DILATION;  Surgeon: Corbin Adeobert M Rourk, MD;  [facility];  Service: Endoscopy;  Laterality: N/A;  Elease Hashimoto. Maloney dilation N/A 04/23/2014    Procedure: Elease HashimotoMALONEY DILATION;  Surgeon: Corbin Adeobert M Rourk, MD;  [facility];  Service: Endoscopy;  Laterality: N/A;    Current Outpatient Prescriptions  Medication Sig Dispense Refill  . bisoprolol-hydrochlorothiazide (ZIAC) 5-6.25 MG per tablet Take 1 tablet by mouth daily.    . diphenoxylate-atropine (LOMOTIL) 2.5-0.025 MG per tablet Take by mouth 4 (four) times  daily as needed for diarrhea or loose stools. As directed for diarrhea    . fenofibrate (TRICOR) 145 MG tablet Take 145 mg by mouth daily.    Marland Kitchen. levothyroxine (SYNTHROID, LEVOTHROID) 137 MCG tablet Take 137 mcg by mouth daily before breakfast.    . mirabegron ER (MYRBETRIQ) 50 MG TB24 tablet Take 50 mg by mouth daily.    . sertraline (ZOLOFT) 100 MG tablet Take 100 mg by mouth daily. Takes one and one half tablet every morning    . simvastatin (ZOCOR) 40 MG tablet Take 40 mg by mouth daily.    Marland Kitchen. dicyclomine (BENTYL) 10 MG capsule Take 1 capsule (10 mg total) by mouth 4 (four) times daily -  before meals and at bedtime. (Patient not taking: Reported on 09/09/2014) 120 capsule 3  . pantoprazole (PROTONIX) 40 MG tablet Take 1 tablet (40 mg total) by mouth daily. Take 30 minutes prior to breakfast. (Patient not taking: Reported on 09/09/2014) 90 tablet 3   No current facility-administered medications for this visit.    Allergies as of 09/09/2014 - Review Complete 06/03/2014  Allergen Reaction Noted  . Asa [aspirin] Anaphylaxis 04/01/2014  . Penicillins Itching 04/01/2014    Family History  Problem Relation Age of Onset  . Colon cancer Neg Hx     History   Social History  . Marital Status: Widowed    Spouse Name: N/A    Number of Children: N/A  . Years of Education: N/A   Social History Main  Topics  . Smoking status: Current Some Day Smoker -- 1.00 packs/day    Types: Cigarettes  . Smokeless tobacco: Never Used     Comment: Smokes one pack of cigarettes daily  . Alcohol Use: No  . Drug Use: No  . Sexual Activity: Not Currently   Other Topics Concern  . None   Social History Narrative    Review of Systems: As mentioned in HPI.   Physical Exam: BP 126/73 mmHg  Pulse 54  Temp(Src) 96.6 F (35.9 C)  Ht 5\' 3"  (1.6 m)  Wt 125 lb 3.2 oz (56.79 kg)  BMI 22.18 kg/m2 General:   Alert and oriented. No distress noted. Pleasant and cooperative.  Head:  Normocephalic and  atraumatic. Eyes:  Conjuctiva clear without scleral icterus. Abdomen:  +BS, soft, non-tender and non-distended. No rebound or guarding. No HSM or masses noted. Msk:  Symmetrical without gross deformities. Normal posture. Extremities:  Without edema. Neurologic:  Alert and  oriented x4;  grossly normal neurologically. Psych:  Alert and cooperative. Normal mood and affect.

## 2014-09-09 NOTE — Assessment & Plan Note (Signed)
Likely multifactorial with lack of food but unable to exclude occult malignancy. Concerning that she has continued to lose weight. Will arrange CT abd/pelvis. I have given her the number for the [employer], which provides a free meal Mon-Thursday and also assistance with utility bill if needed.

## 2014-09-09 NOTE — Patient Instructions (Addendum)
We have set you up for a CT scan due to continued weight loss. You need to have blood work before you do this.  Further recommendations to follow.

## 2014-09-10 LAB — BASIC METABOLIC PANEL
BUN: 24 mg/dL — AB (ref 6–23)
CHLORIDE: 106 meq/L (ref 96–112)
CO2: 25 meq/L (ref 19–32)
Calcium: 9.4 mg/dL (ref 8.4–10.5)
Creat: 0.9 mg/dL (ref 0.50–1.10)
Glucose, Bld: 92 mg/dL (ref 70–99)
Potassium: 4.5 mEq/L (ref 3.5–5.3)
Sodium: 140 mEq/L (ref 135–145)

## 2014-09-14 ENCOUNTER — Encounter (HOSPITAL_COMMUNITY): Payer: Self-pay

## 2014-09-14 ENCOUNTER — Ambulatory Visit (HOSPITAL_COMMUNITY)
Admission: RE | Admit: 2014-09-14 | Discharge: 2014-09-14 | Disposition: A | Discharge status: Home / Self Care | Payer: PRIVATE HEALTH INSURANCE | Source: Ambulatory Visit | Attending: Gastroenterology | Admitting: Gastroenterology

## 2014-09-14 DIAGNOSIS — R634 Abnormal weight loss: Secondary | ICD-10-CM | POA: Diagnosis present

## 2014-09-14 DIAGNOSIS — K8020 Calculus of gallbladder without cholecystitis without obstruction: Secondary | ICD-10-CM | POA: Diagnosis not present

## 2014-09-14 DIAGNOSIS — K760 Fatty (change of) liver, not elsewhere classified: Secondary | ICD-10-CM | POA: Diagnosis not present

## 2014-09-14 DIAGNOSIS — K7689 Other specified diseases of liver: Secondary | ICD-10-CM | POA: Diagnosis not present

## 2014-09-14 MED ORDER — IOHEXOL 300 MG/ML  SOLN
100.0000 mL | Freq: Once | INTRAMUSCULAR | Status: AC | PRN
  Administered 2014-09-14: 100 mL via INTRAVENOUS

## 2014-09-16 NOTE — Progress Notes (Signed)
Quick Note:  No evidence for occult malignancy on CT. Weight loss likely secondary to decreased po intake. ______

## 2014-11-10 ENCOUNTER — Telehealth: Payer: Self-pay | Admitting: Internal Medicine

## 2014-11-10 NOTE — Telephone Encounter (Signed)
[facility] and would like a note from [facility] saying that she can't tolerate pork or sausage very well.  She is hoping they can substitute that with something else she is able to tolerate. Please advise. [phone]

## 2014-11-10 NOTE — Telephone Encounter (Signed)
Routing to [facility] 

## 2014-11-11 ENCOUNTER — Encounter: Payer: Self-pay | Admitting: Gastroenterology

## 2014-11-11 NOTE — Telephone Encounter (Signed)
Letter is at the front desk for pt to pick up. Pt is aware.

## 2014-11-11 NOTE — Telephone Encounter (Signed)
Printed letter

## 2015-01-15 ENCOUNTER — Other Ambulatory Visit (HOSPITAL_COMMUNITY): Payer: Self-pay | Admitting: Pulmonary Disease

## 2015-01-15 DIAGNOSIS — Z1231 Encounter for screening mammogram for malignant neoplasm of breast: Secondary | ICD-10-CM

## 2015-01-21 ENCOUNTER — Ambulatory Visit (HOSPITAL_COMMUNITY)
Admission: RE | Admit: 2015-01-21 | Discharge: 2015-01-21 | Disposition: A | Discharge status: Home / Self Care | Payer: Medicare Other | Source: Ambulatory Visit | Attending: Pulmonary Disease | Admitting: Pulmonary Disease

## 2015-01-21 DIAGNOSIS — Z1231 Encounter for screening mammogram for malignant neoplasm of breast: Secondary | ICD-10-CM | POA: Diagnosis not present

## 2015-02-18 DIAGNOSIS — I4891 Unspecified atrial fibrillation: Secondary | ICD-10-CM | POA: Diagnosis not present

## 2015-02-18 DIAGNOSIS — K580 Irritable bowel syndrome with diarrhea: Secondary | ICD-10-CM | POA: Diagnosis not present

## 2015-02-18 DIAGNOSIS — I10 Essential (primary) hypertension: Secondary | ICD-10-CM | POA: Diagnosis not present

## 2015-02-22 DIAGNOSIS — K580 Irritable bowel syndrome with diarrhea: Secondary | ICD-10-CM | POA: Diagnosis not present

## 2015-02-22 DIAGNOSIS — I10 Essential (primary) hypertension: Secondary | ICD-10-CM | POA: Diagnosis not present

## 2015-02-22 DIAGNOSIS — I4891 Unspecified atrial fibrillation: Secondary | ICD-10-CM | POA: Diagnosis not present

## 2015-06-13 ENCOUNTER — Other Ambulatory Visit: Payer: Self-pay | Admitting: Gastroenterology

## 2015-06-15 ENCOUNTER — Other Ambulatory Visit: Payer: Self-pay

## 2015-06-15 MED ORDER — PANTOPRAZOLE SODIUM 40 MG PO TBEC: DELAYED_RELEASE_TABLET | ORAL | Status: DC

## 2015-09-23 DIAGNOSIS — I10 Essential (primary) hypertension: Secondary | ICD-10-CM | POA: Diagnosis not present

## 2015-09-23 DIAGNOSIS — I4891 Unspecified atrial fibrillation: Secondary | ICD-10-CM | POA: Diagnosis not present

## 2015-09-23 DIAGNOSIS — N3281 Overactive bladder: Secondary | ICD-10-CM | POA: Diagnosis not present

## 2015-09-23 DIAGNOSIS — M25511 Pain in right shoulder: Secondary | ICD-10-CM | POA: Diagnosis not present

## 2015-10-15 ENCOUNTER — Ambulatory Visit (HOSPITAL_COMMUNITY)
Admission: RE | Admit: 2015-10-15 | Discharge: 2015-10-15 | Disposition: A | Discharge status: Home / Self Care | Payer: Medicare Other | Source: Ambulatory Visit | Attending: Pulmonary Disease | Admitting: Pulmonary Disease

## 2015-10-15 ENCOUNTER — Other Ambulatory Visit (HOSPITAL_COMMUNITY): Payer: Self-pay | Admitting: Pulmonary Disease

## 2015-10-15 DIAGNOSIS — M25511 Pain in right shoulder: Secondary | ICD-10-CM

## 2015-10-15 DIAGNOSIS — I4891 Unspecified atrial fibrillation: Secondary | ICD-10-CM | POA: Diagnosis not present

## 2015-11-24 ENCOUNTER — Other Ambulatory Visit: Payer: Self-pay | Admitting: Gastroenterology

## 2016-03-22 DIAGNOSIS — K580 Irritable bowel syndrome with diarrhea: Secondary | ICD-10-CM | POA: Diagnosis not present

## 2016-03-22 DIAGNOSIS — E039 Hypothyroidism, unspecified: Secondary | ICD-10-CM | POA: Diagnosis not present

## 2016-03-22 DIAGNOSIS — I4891 Unspecified atrial fibrillation: Secondary | ICD-10-CM | POA: Diagnosis not present

## 2016-03-22 DIAGNOSIS — I10 Essential (primary) hypertension: Secondary | ICD-10-CM | POA: Diagnosis not present

## 2016-04-07 DIAGNOSIS — I4891 Unspecified atrial fibrillation: Secondary | ICD-10-CM | POA: Diagnosis not present

## 2016-04-07 DIAGNOSIS — E039 Hypothyroidism, unspecified: Secondary | ICD-10-CM | POA: Diagnosis not present

## 2016-04-07 DIAGNOSIS — K580 Irritable bowel syndrome with diarrhea: Secondary | ICD-10-CM | POA: Diagnosis not present

## 2016-04-07 DIAGNOSIS — I10 Essential (primary) hypertension: Secondary | ICD-10-CM | POA: Diagnosis not present

## 2016-05-03 ENCOUNTER — Other Ambulatory Visit (HOSPITAL_COMMUNITY): Payer: Self-pay | Admitting: Pulmonary Disease

## 2016-05-03 DIAGNOSIS — Z1231 Encounter for screening mammogram for malignant neoplasm of breast: Secondary | ICD-10-CM

## 2016-05-08 IMAGING — US US ABDOMEN COMPLETE
1 series · 14 of 25 positions shown · non-contrast
Comparison: None.

CLINICAL DATA: Epigastric abdominal pain

EXAM:
ULTRASOUND ABDOMEN COMPLETE

[Series 1: us abdomen complete · 0.15mm/px · 14 of 150 slices shown]
[im 1/150]
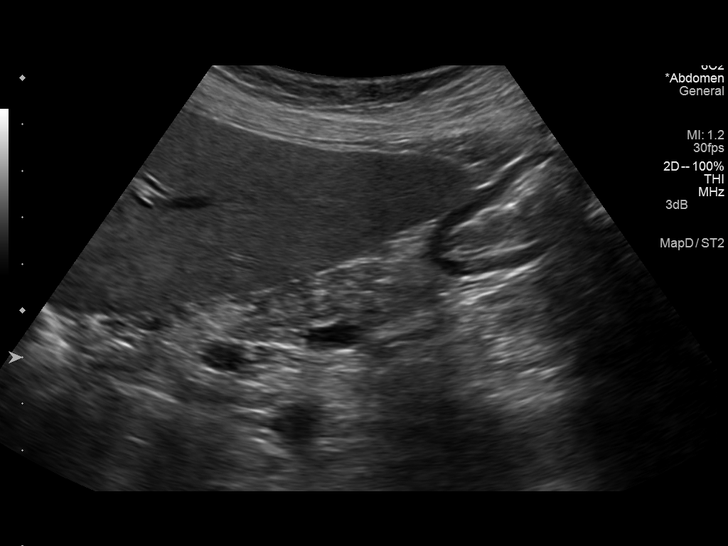
[im 13/150]
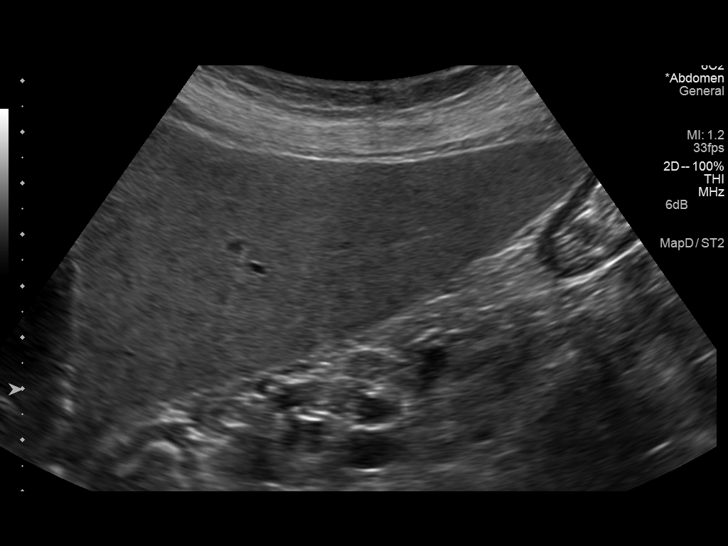
[im 25/150]
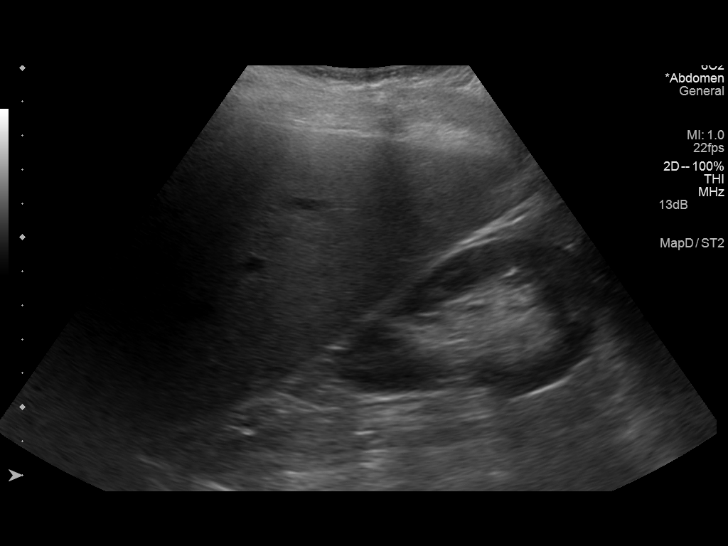
[im 38/150]
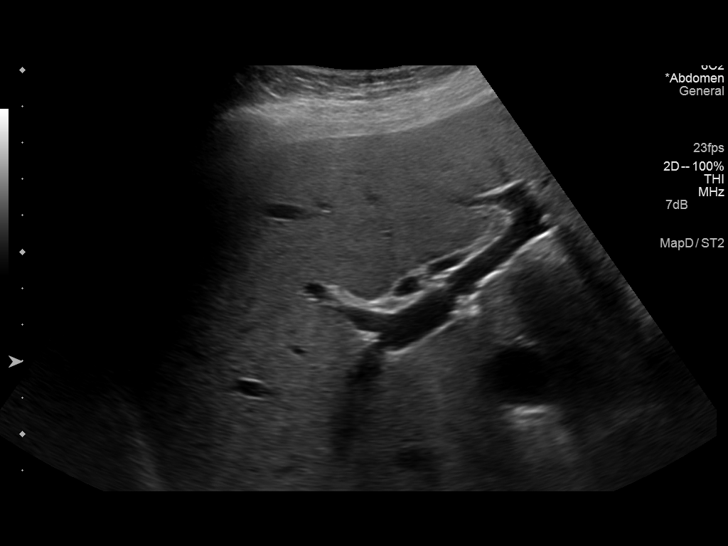
[im 50/150]
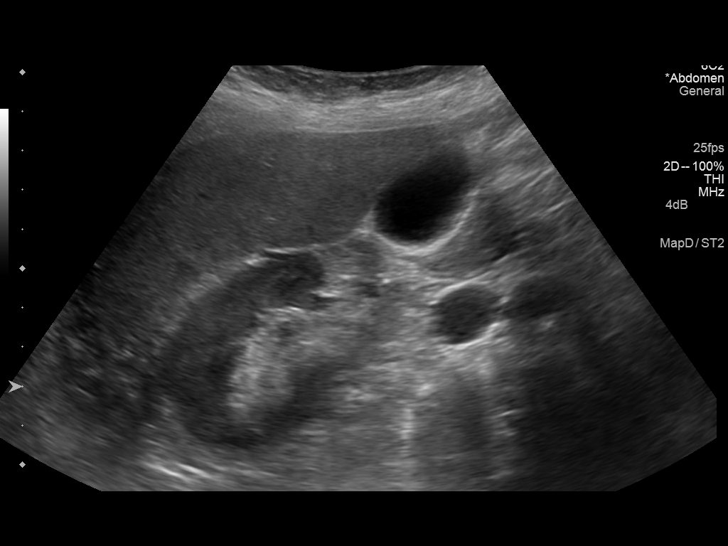
[im 56/150]
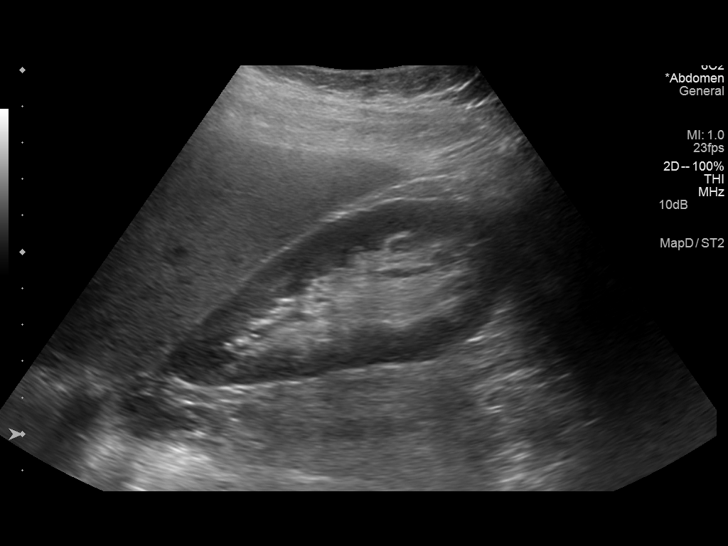
[im 69/150]
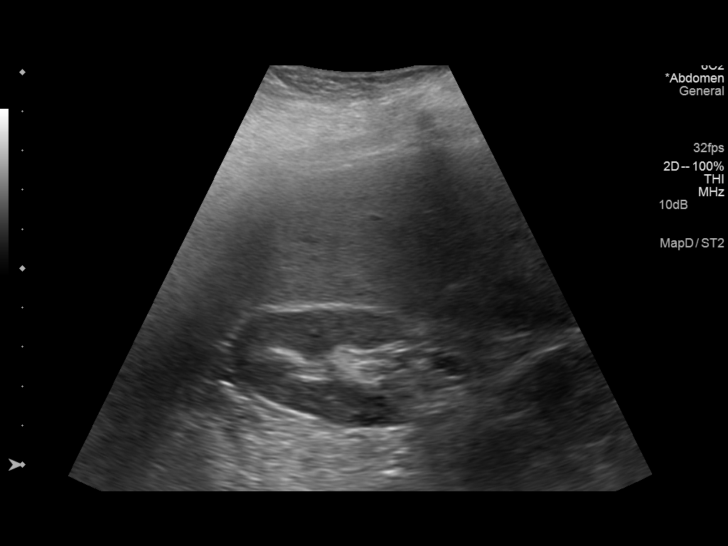
[im 81/150]
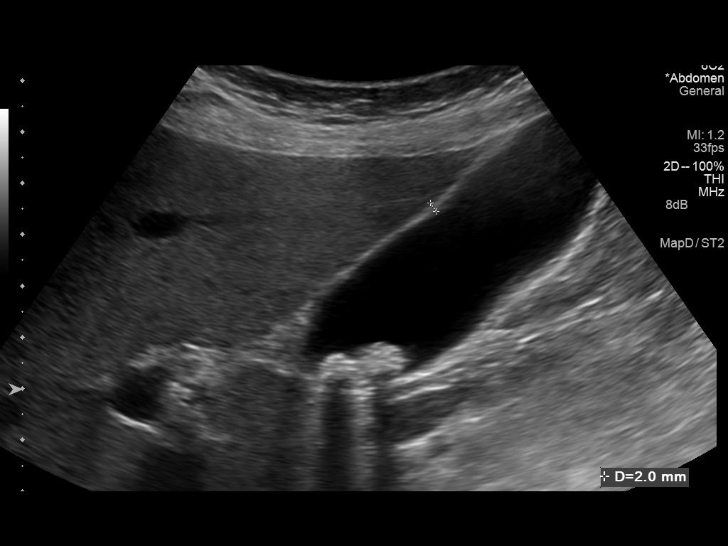
[im 94/150]
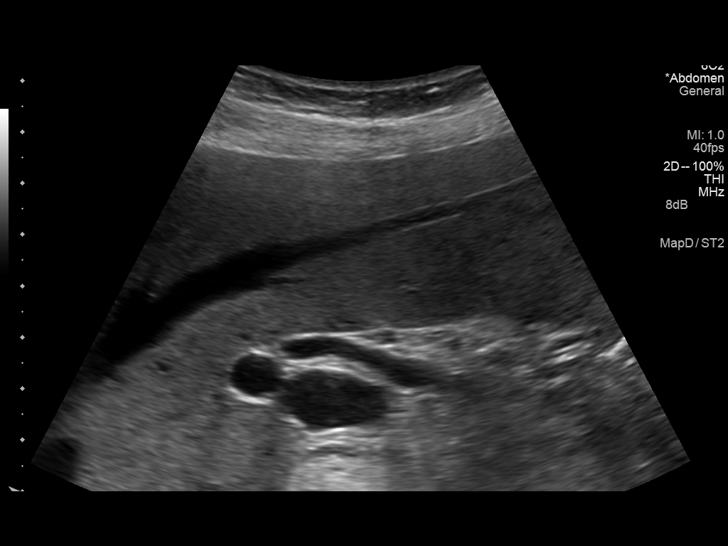
[im 100/150]
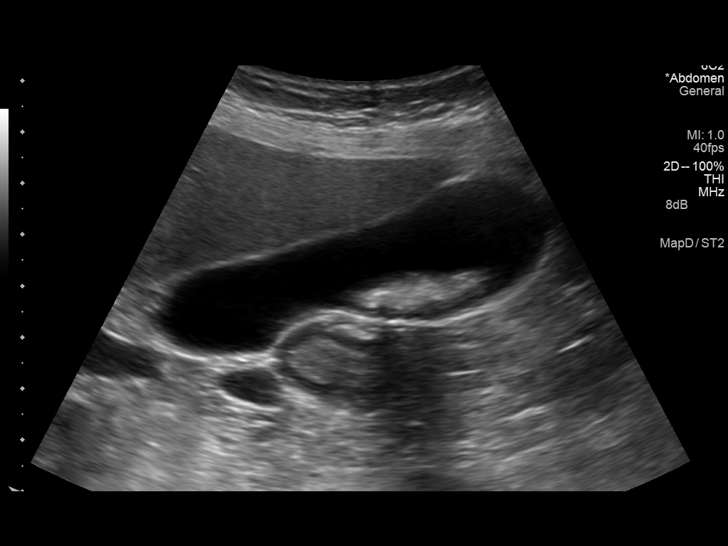
[im 112/150]
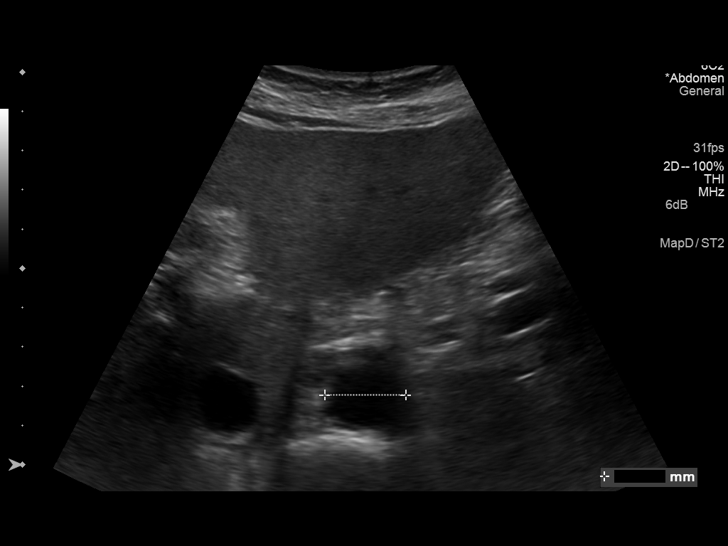
[im 125/150]
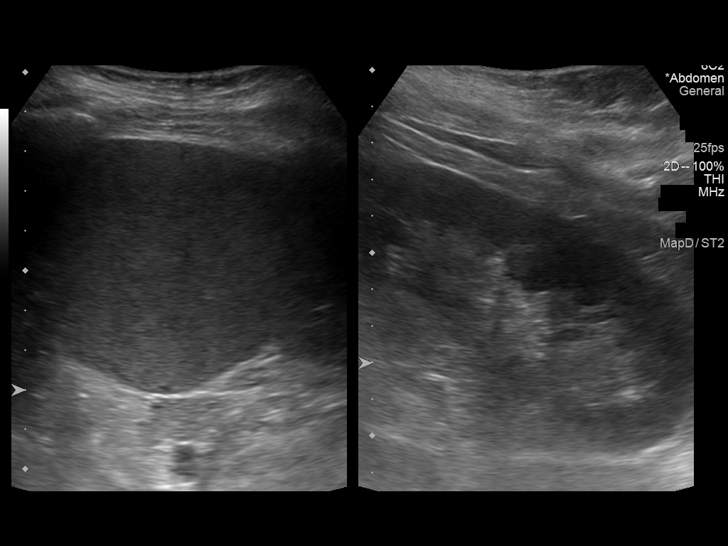
[im 137/150]
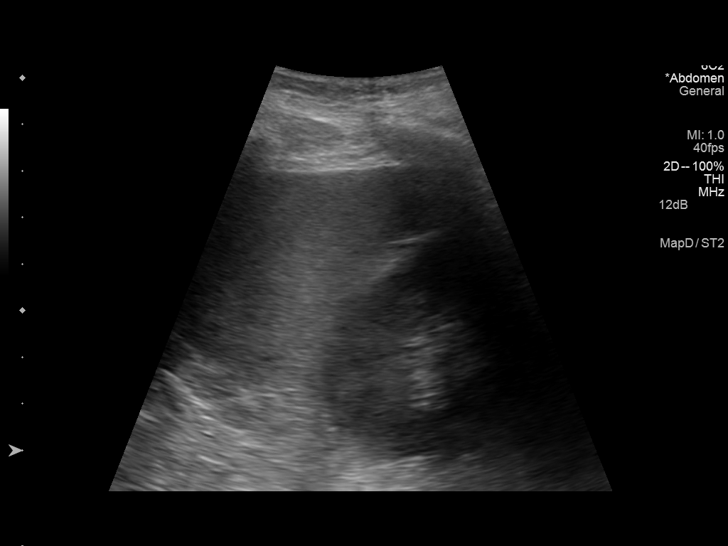
[im 150/150]
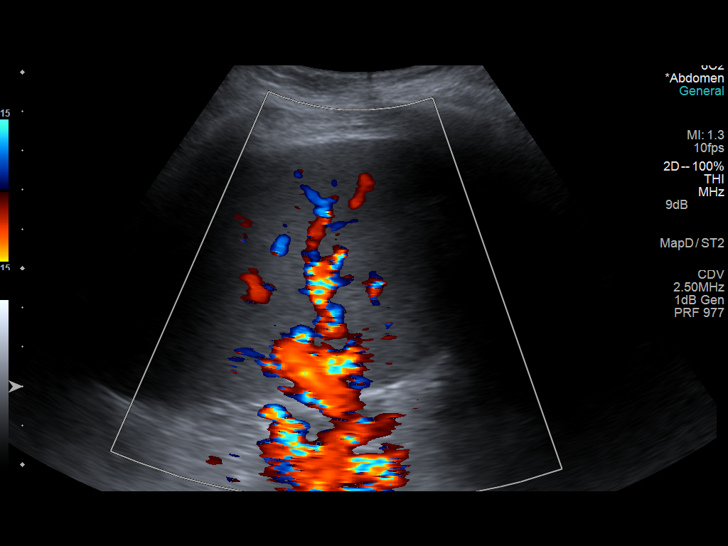

[14 of 25 positions shown; findings below may reference images not displayed]

FINDINGS: Gallbladder:

Multiple small gallstones identified, grouped together dependently
within the gallbladder lumen, measuring 2.7 cm in aggregate. No
gallbladder wall thickening or Murphy's sign.

Common bile duct:

Diameter: 5 mm

Liver:

No focal lesion identified. Within normal limits in parenchymal
echogenicity.

IVC:

No abnormality visualized.

Pancreas:

Not seen well due to overlying bowel gas. No evidence of obvious
abnormality.

Spleen:

Size and appearance within normal limits.

Right Kidney:

Length: 10.7 cm. Echogenicity within normal limits. No mass or
hydronephrosis visualized.

Left Kidney:

Length: 11.5 cm. Echogenicity within normal limits. No mass or
hydronephrosis visualized.

Abdominal aorta:

No dilatation. Some atherosclerotic calcification involving the mid
to distal aorta.

Other findings:

None
IMPRESSION: Cholelithiasis with no Murphy's sign or gallbladder wall thickening.
Pancreas not well characterized.

## 2016-05-11 ENCOUNTER — Ambulatory Visit (HOSPITAL_COMMUNITY)
Admission: RE | Admit: 2016-05-11 | Discharge: 2016-05-11 | Disposition: A | Discharge status: Home / Self Care | Payer: Medicare Other | Source: Ambulatory Visit | Attending: Pulmonary Disease | Admitting: Pulmonary Disease

## 2016-05-11 DIAGNOSIS — Z1231 Encounter for screening mammogram for malignant neoplasm of breast: Secondary | ICD-10-CM | POA: Insufficient documentation

## 2016-06-10 ENCOUNTER — Other Ambulatory Visit: Payer: Self-pay | Admitting: Gastroenterology

## 2016-06-12 ENCOUNTER — Encounter: Payer: Self-pay | Admitting: Gastroenterology

## 2016-06-12 NOTE — Telephone Encounter (Signed)
Please schedule ov.  

## 2016-06-12 NOTE — Telephone Encounter (Signed)
APPT MADE AND LETTER SENT  °

## 2016-06-12 NOTE — Telephone Encounter (Signed)
Needs OV. Has not been seen since 09/2014. Refill X 3.

## 2016-07-07 ENCOUNTER — Encounter: Payer: Self-pay | Admitting: Gastroenterology

## 2016-07-07 ENCOUNTER — Ambulatory Visit (INDEPENDENT_AMBULATORY_CARE_PROVIDER_SITE_OTHER): Payer: Medicare Other | Admitting: Gastroenterology

## 2016-07-07 VITALS — BP 145/70 | HR 52 | Temp 97.3°F | Ht 63.0 in | Wt 134.2 lb

## 2016-07-07 DIAGNOSIS — R195 Other fecal abnormalities: Secondary | ICD-10-CM

## 2016-07-07 DIAGNOSIS — K219 Gastro-esophageal reflux disease without esophagitis: Secondary | ICD-10-CM

## 2016-07-07 MED ORDER — DICYCLOMINE HCL 10 MG PO CAPS: 10.0000 mg | ORAL_CAPSULE | Freq: Three times a day (TID) | ORAL | 3 refills | Status: DC

## 2016-07-07 MED ORDER — PANTOPRAZOLE SODIUM 40 MG PO TBEC: DELAYED_RELEASE_TABLET | ORAL | 3 refills | Status: DC

## 2016-07-07 NOTE — Patient Instructions (Signed)
1. New RX for Bentyl (dicyclomine) and Protonix sent to your pharmacy.  2. Return to the office in two years or sooner if needed.

## 2016-07-07 NOTE — Progress Notes (Signed)
      Primary Care Physician: Fredirick MaudlinHAWKINS,EDWARD L, MD  Primary Gastroenterologist:  Roetta SessionsMichael Rourk, MD   Chief Complaint  Patient presents with  . Follow-up    HPI: Madeline Branch is a 75 y.o. female here for 2 year follow up. She has intermittent loose stools well managed on bentyl. H/o dyspepsia controlled on pantoprazole. EGD/TCS in 2015 for diarrhea and weight loss. She had chronic inactive gastritis but otherwise unremarkable. CT A/P with gallstones.  Overall doing well. Weight up 12 pounds. Takes Lomotil once daily and bentyl once daily. BM 2-3/day. No melena, brbpr. Denies abdominal pain. No heartburn, dysphagia.    Current Outpatient Prescriptions  Medication Sig Dispense Refill  . bisoprolol-hydrochlorothiazide (ZIAC) 5-6.25 MG per tablet Take 1 tablet by mouth daily.    Marland Kitchen. dicyclomine (BENTYL) 10 MG capsule Take 1 capsule (10 mg total) by mouth 4 (four) times daily -  before meals and at bedtime. As needed for abdominal cramp or diarrhea 120 capsule 3  . diphenoxylate-atropine (LOMOTIL) 2.5-0.025 MG per tablet Take by mouth 4 (four) times daily as needed for diarrhea or loose stools. As directed for diarrhea    . fenofibrate (TRICOR) 145 MG tablet Take 145 mg by mouth daily.    Marland Kitchen. levothyroxine (SYNTHROID, LEVOTHROID) 137 MCG tablet Take 137 mcg by mouth daily before breakfast.    . mirabegron ER (MYRBETRIQ) 50 MG TB24 tablet Take 50 mg by mouth daily.    . pantoprazole (PROTONIX) 40 MG tablet TAKE 1 TABLET BY MOUTH EVERY DAY 30 MINUTES PRIOR TO BREAKFAST 90 tablet 3  . sertraline (ZOLOFT) 100 MG tablet Take 100 mg by mouth daily. Takes one and one half tablet every morning    . simvastatin (ZOCOR) 40 MG tablet Take 40 mg by mouth daily.     No current facility-administered medications for this visit.     Allergies as of 07/07/2016 - Review Complete 07/07/2016  Allergen Reaction Noted  . Asa [aspirin] Anaphylaxis 04/01/2014  . Penicillins Itching 04/01/2014     ROS:  General: Negative for anorexia, weight loss, fever, chills, fatigue, weakness. ENT: Negative for hoarseness, difficulty swallowing , nasal congestion. CV: Negative for chest pain, angina, palpitations, dyspnea on exertion, peripheral edema.  Respiratory: Negative for dyspnea at rest, dyspnea on exertion, cough, sputum, wheezing.  GI: See history of present illness. GU:  Negative for dysuria, hematuria, urinary incontinence, urinary frequency, nocturnal urination.  Endo: Negative for unusual weight change.    Physical Examination:   BP (!) 145/70   Pulse (!) 52   Temp 97.3 F (36.3 C) (Oral)   Ht 5\' 3"  (1.6 m)   Wt 134 lb 3.2 oz (60.9 kg)   BMI 23.77 kg/m   General: Well-nourished, well-developed in no acute distress.  Eyes: No icterus. Mouth: Oropharyngeal mucosa moist and pink , no lesions erythema or exudate. Lungs: Clear to auscultation bilaterally.  Heart: Regular rate and rhythm, no murmurs rubs or gallops.  Abdomen: Bowel sounds are normal, nontender, nondistended, no hepatosplenomegaly or masses, no abdominal bruits or hernia , no rebound or guarding.   Extremities: No lower extremity edema. No clubbing or deformities. Neuro: Alert and oriented x 4   Skin: Warm and dry, no jaundice.   Psych: Alert and cooperative, normal mood and affect.

## 2016-07-07 NOTE — Assessment & Plan Note (Signed)
Dyspepsia/gerd well controlled on protonix.return to the office in 2 years.

## 2016-07-07 NOTE — Assessment & Plan Note (Signed)
Improved on low dose bentyl/lomotil. Controlled with diet. No further weight loss and in fact she has had significant weight gain.

## 2016-07-10 NOTE — Progress Notes (Signed)
cc'ed to pcp °

## 2017-01-17 ENCOUNTER — Other Ambulatory Visit: Payer: Self-pay | Admitting: Gastroenterology

## 2017-10-18 ENCOUNTER — Other Ambulatory Visit: Payer: Self-pay | Admitting: Gastroenterology

## 2017-11-29 ENCOUNTER — Other Ambulatory Visit: Payer: Self-pay | Admitting: Nurse Practitioner

## 2018-01-08 ENCOUNTER — Other Ambulatory Visit: Payer: Self-pay | Admitting: Gastroenterology

## 2018-01-08 DIAGNOSIS — E785 Hyperlipidemia, unspecified: Secondary | ICD-10-CM | POA: Diagnosis not present

## 2018-01-08 DIAGNOSIS — I4891 Unspecified atrial fibrillation: Secondary | ICD-10-CM | POA: Diagnosis not present

## 2018-01-08 DIAGNOSIS — I10 Essential (primary) hypertension: Secondary | ICD-10-CM | POA: Diagnosis not present

## 2018-01-08 DIAGNOSIS — M25551 Pain in right hip: Secondary | ICD-10-CM | POA: Diagnosis not present

## 2018-01-15 ENCOUNTER — Other Ambulatory Visit (HOSPITAL_COMMUNITY)
Admission: RE | Admit: 2018-01-15 | Discharge: 2018-01-15 | Disposition: A | Discharge status: Home / Self Care | Payer: Medicare Other | Source: Ambulatory Visit | Attending: Pulmonary Disease | Admitting: Pulmonary Disease

## 2018-01-15 ENCOUNTER — Other Ambulatory Visit (HOSPITAL_COMMUNITY): Payer: Self-pay | Admitting: Pulmonary Disease

## 2018-01-15 ENCOUNTER — Ambulatory Visit (HOSPITAL_COMMUNITY)
Admission: RE | Admit: 2018-01-15 | Discharge: 2018-01-15 | Disposition: A | Discharge status: Home / Self Care | Payer: Medicare Other | Source: Ambulatory Visit | Attending: Pulmonary Disease | Admitting: Pulmonary Disease

## 2018-01-15 DIAGNOSIS — M25551 Pain in right hip: Secondary | ICD-10-CM

## 2018-01-15 DIAGNOSIS — I4891 Unspecified atrial fibrillation: Secondary | ICD-10-CM | POA: Insufficient documentation

## 2018-01-15 DIAGNOSIS — E785 Hyperlipidemia, unspecified: Secondary | ICD-10-CM | POA: Insufficient documentation

## 2018-01-15 DIAGNOSIS — I10 Essential (primary) hypertension: Secondary | ICD-10-CM | POA: Diagnosis not present

## 2018-01-15 LAB — LIPID PANEL
CHOLESTEROL: 152 mg/dL (ref 0–200)
HDL: 40 mg/dL — AB (ref >40)
LDL Cholesterol: 76 mg/dL (ref 0–99)
TRIGLYCERIDES: 181 mg/dL — AB (ref <150)
Total CHOL/HDL Ratio: 3.8 RATIO
VLDL: 36 mg/dL (ref 0–40)

## 2018-01-15 LAB — CBC WITH DIFFERENTIAL/PLATELET
BASOS PCT: 1 %
Basophils Absolute: 0 10*3/uL (ref 0.0–0.1)
EOS PCT: 3 %
Eosinophils Absolute: 0.2 10*3/uL (ref 0.0–0.7)
HCT: 40.4 % (ref 36.0–46.0)
Hemoglobin: 13 g/dL (ref 12.0–15.0)
Lymphocytes Relative: 17 %
Lymphs Abs: 0.9 10*3/uL (ref 0.7–4.0)
MCH: 28.2 pg (ref 26.0–34.0)
MCHC: 32.2 g/dL (ref 30.0–36.0)
MCV: 87.6 fL (ref 78.0–100.0)
MONO ABS: 0.3 10*3/uL (ref 0.1–1.0)
MONOS PCT: 5 %
NEUTROS ABS: 4.1 10*3/uL (ref 1.7–7.7)
Neutrophils Relative %: 74 %
Platelets: 174 10*3/uL (ref 150–400)
RBC: 4.61 MIL/uL (ref 3.87–5.11)
RDW: 14 % (ref 11.5–15.5)
WBC: 5.5 10*3/uL (ref 4.0–10.5)

## 2018-01-15 LAB — TSH: TSH: 9.74 u[IU]/mL — ABNORMAL HIGH (ref 0.350–4.500)

## 2018-01-15 LAB — COMPREHENSIVE METABOLIC PANEL
ALT: 17 U/L (ref 14–54)
ANION GAP: 8 (ref 5–15)
AST: 24 U/L (ref 15–41)
Albumin: 4.4 g/dL (ref 3.5–5.0)
Alkaline Phosphatase: 35 U/L — ABNORMAL LOW (ref 38–126)
BUN: 23 mg/dL — ABNORMAL HIGH (ref 6–20)
CHLORIDE: 108 mmol/L (ref 101–111)
CO2: 23 mmol/L (ref 22–32)
CREATININE: 1.08 mg/dL — AB (ref 0.44–1.00)
Calcium: 9.2 mg/dL (ref 8.9–10.3)
GFR, EST AFRICAN AMERICAN: 57 mL/min — AB (ref >60)
GFR, EST NON AFRICAN AMERICAN: 49 mL/min — AB (ref >60)
Glucose, Bld: 94 mg/dL (ref 65–99)
POTASSIUM: 4.6 mmol/L (ref 3.5–5.1)
SODIUM: 139 mmol/L (ref 135–145)
Total Bilirubin: 0.7 mg/dL (ref 0.3–1.2)
Total Protein: 7.5 g/dL (ref 6.5–8.1)

## 2018-02-22 ENCOUNTER — Other Ambulatory Visit: Payer: Self-pay | Admitting: Gastroenterology

## 2018-05-13 ENCOUNTER — Encounter: Payer: Self-pay | Admitting: Internal Medicine

## 2018-05-14 ENCOUNTER — Other Ambulatory Visit: Payer: Self-pay

## 2018-05-14 NOTE — Patient Outreach (Signed)
[facility] Care Management  05/14/2018  Madeline Branch 08/23/1941 [id number]   Medication Adherence call to [name] left a message for patient to call back patient is due on Simvastatin 40 mg. [name] is showing past due under [facility].   Madeline AbedAna Branch CPhT Pharmacy Technician [facility] Care Management Direct Dial [phone]  Fax [phone] [email or web address]

## 2018-06-24 ENCOUNTER — Other Ambulatory Visit: Payer: Self-pay | Admitting: Nurse Practitioner

## 2018-10-08 ENCOUNTER — Ambulatory Visit (INDEPENDENT_AMBULATORY_CARE_PROVIDER_SITE_OTHER): Payer: Medicare Other | Admitting: Urology

## 2018-10-08 DIAGNOSIS — R3915 Urgency of urination: Secondary | ICD-10-CM | POA: Diagnosis not present

## 2018-10-08 DIAGNOSIS — N3941 Urge incontinence: Secondary | ICD-10-CM

## 2018-11-12 ENCOUNTER — Ambulatory Visit (INDEPENDENT_AMBULATORY_CARE_PROVIDER_SITE_OTHER): Payer: Medicare Other | Admitting: Urology

## 2018-11-12 DIAGNOSIS — N3941 Urge incontinence: Secondary | ICD-10-CM

## 2018-11-12 DIAGNOSIS — R3915 Urgency of urination: Secondary | ICD-10-CM | POA: Diagnosis not present

## 2019-09-17 ENCOUNTER — Other Ambulatory Visit: Payer: Self-pay | Admitting: Nurse Practitioner

## 2019-10-20 ENCOUNTER — Encounter: Payer: Self-pay | Admitting: Urology

## 2020-03-03 DIAGNOSIS — E039 Hypothyroidism, unspecified: Secondary | ICD-10-CM | POA: Diagnosis not present

## 2020-03-03 DIAGNOSIS — Z0000 Encounter for general adult medical examination without abnormal findings: Secondary | ICD-10-CM | POA: Diagnosis not present

## 2020-03-03 DIAGNOSIS — E785 Hyperlipidemia, unspecified: Secondary | ICD-10-CM | POA: Diagnosis not present

## 2020-03-03 DIAGNOSIS — K588 Other irritable bowel syndrome: Secondary | ICD-10-CM | POA: Diagnosis not present

## 2020-03-03 DIAGNOSIS — I10 Essential (primary) hypertension: Secondary | ICD-10-CM | POA: Diagnosis not present

## 2022-01-16 ENCOUNTER — Emergency Department (HOSPITAL_COMMUNITY): Payer: Medicare Other

## 2022-01-16 ENCOUNTER — Encounter (HOSPITAL_COMMUNITY): Payer: Self-pay

## 2022-01-16 ENCOUNTER — Other Ambulatory Visit: Payer: Self-pay

## 2022-01-16 ENCOUNTER — Inpatient Hospital Stay (HOSPITAL_COMMUNITY)
Admission: EM | Admit: 2022-01-16 | Discharge: 2022-01-24 | DRG: 190 | Disposition: A | Discharge status: Skilled Nursing Facility | Payer: Medicare Other | Attending: Internal Medicine | Admitting: Internal Medicine

## 2022-01-16 DIAGNOSIS — Z79890 Hormone replacement therapy (postmenopausal): Secondary | ICD-10-CM

## 2022-01-16 DIAGNOSIS — M1990 Unspecified osteoarthritis, unspecified site: Secondary | ICD-10-CM | POA: Diagnosis present

## 2022-01-16 DIAGNOSIS — I4891 Unspecified atrial fibrillation: Secondary | ICD-10-CM | POA: Diagnosis present

## 2022-01-16 DIAGNOSIS — M25552 Pain in left hip: Secondary | ICD-10-CM | POA: Diagnosis not present

## 2022-01-16 DIAGNOSIS — E785 Hyperlipidemia, unspecified: Secondary | ICD-10-CM

## 2022-01-16 DIAGNOSIS — R200 Anesthesia of skin: Secondary | ICD-10-CM | POA: Diagnosis not present

## 2022-01-16 DIAGNOSIS — R7989 Other specified abnormal findings of blood chemistry: Secondary | ICD-10-CM | POA: Diagnosis present

## 2022-01-16 DIAGNOSIS — Z880 Allergy status to penicillin: Secondary | ICD-10-CM

## 2022-01-16 DIAGNOSIS — R0902 Hypoxemia: Secondary | ICD-10-CM | POA: Diagnosis present

## 2022-01-16 DIAGNOSIS — R0602 Shortness of breath: Secondary | ICD-10-CM | POA: Diagnosis not present

## 2022-01-16 DIAGNOSIS — I248 Other forms of acute ischemic heart disease: Secondary | ICD-10-CM | POA: Diagnosis present

## 2022-01-16 DIAGNOSIS — Z751 Person awaiting admission to adequate facility elsewhere: Secondary | ICD-10-CM

## 2022-01-16 DIAGNOSIS — E039 Hypothyroidism, unspecified: Secondary | ICD-10-CM | POA: Diagnosis present

## 2022-01-16 DIAGNOSIS — F32A Depression, unspecified: Secondary | ICD-10-CM | POA: Diagnosis present

## 2022-01-16 DIAGNOSIS — R531 Weakness: Secondary | ICD-10-CM | POA: Diagnosis present

## 2022-01-16 DIAGNOSIS — Z886 Allergy status to analgesic agent status: Secondary | ICD-10-CM | POA: Diagnosis not present

## 2022-01-16 DIAGNOSIS — Z90710 Acquired absence of both cervix and uterus: Secondary | ICD-10-CM | POA: Diagnosis not present

## 2022-01-16 DIAGNOSIS — Z79899 Other long term (current) drug therapy: Secondary | ICD-10-CM | POA: Diagnosis not present

## 2022-01-16 DIAGNOSIS — E079 Disorder of thyroid, unspecified: Secondary | ICD-10-CM | POA: Diagnosis not present

## 2022-01-16 DIAGNOSIS — R778 Other specified abnormalities of plasma proteins: Secondary | ICD-10-CM | POA: Diagnosis not present

## 2022-01-16 DIAGNOSIS — E7800 Pure hypercholesterolemia, unspecified: Secondary | ICD-10-CM | POA: Diagnosis present

## 2022-01-16 DIAGNOSIS — J9601 Acute respiratory failure with hypoxia: Secondary | ICD-10-CM | POA: Diagnosis present

## 2022-01-16 DIAGNOSIS — F17200 Nicotine dependence, unspecified, uncomplicated: Secondary | ICD-10-CM

## 2022-01-16 DIAGNOSIS — K219 Gastro-esophageal reflux disease without esophagitis: Secondary | ICD-10-CM | POA: Diagnosis present

## 2022-01-16 DIAGNOSIS — J441 Chronic obstructive pulmonary disease with (acute) exacerbation: Principal | ICD-10-CM | POA: Diagnosis present

## 2022-01-16 DIAGNOSIS — I10 Essential (primary) hypertension: Secondary | ICD-10-CM | POA: Diagnosis present

## 2022-01-16 DIAGNOSIS — F17210 Nicotine dependence, cigarettes, uncomplicated: Secondary | ICD-10-CM | POA: Diagnosis present

## 2022-01-16 LAB — CBC
HCT: 41.3 % (ref 36.0–46.0)
Hemoglobin: 13.3 g/dL (ref 12.0–15.0)
MCH: 28.5 pg (ref 26.0–34.0)
MCHC: 32.2 g/dL (ref 30.0–36.0)
MCV: 88.4 fL (ref 80.0–100.0)
Platelets: 146 10*3/uL — ABNORMAL LOW (ref 150–400)
RBC: 4.67 MIL/uL (ref 3.87–5.11)
RDW: 13.1 % (ref 11.5–15.5)
WBC: 4.9 10*3/uL (ref 4.0–10.5)
nRBC: 0 % (ref 0.0–0.2)

## 2022-01-16 LAB — BLOOD GAS, VENOUS
Acid-base deficit: 0.4 mmol/L (ref 0.0–2.0)
Bicarbonate: 25.9 mmol/L (ref 20.0–28.0)
Drawn by: 61882
FIO2: 32 %
O2 Saturation: 75.6 %
Patient temperature: 36.5
pCO2, Ven: 47 mmHg (ref 44–60)
pH, Ven: 7.35 (ref 7.25–7.43)
pO2, Ven: 42 mmHg (ref 32–45)

## 2022-01-16 LAB — BASIC METABOLIC PANEL
Anion gap: 8 (ref 5–15)
BUN: 18 mg/dL (ref 8–23)
CO2: 24 mmol/L (ref 22–32)
Calcium: 8.8 mg/dL — ABNORMAL LOW (ref 8.9–10.3)
Chloride: 108 mmol/L (ref 98–111)
Creatinine, Ser: 0.88 mg/dL (ref 0.44–1.00)
GFR, Estimated: >60 mL/min (ref >60)
Glucose, Bld: 110 mg/dL — ABNORMAL HIGH (ref 70–99)
Potassium: 3.8 mmol/L (ref 3.5–5.1)
Sodium: 140 mmol/L (ref 135–145)

## 2022-01-16 LAB — TROPONIN I (HIGH SENSITIVITY)
Troponin I (High Sensitivity): 20 ng/L — ABNORMAL HIGH (ref <18)
Troponin I (High Sensitivity): 20 ng/L — ABNORMAL HIGH (ref <18)

## 2022-01-16 LAB — BRAIN NATRIURETIC PEPTIDE: B Natriuretic Peptide: 478 pg/mL — ABNORMAL HIGH (ref 0.0–100.0)

## 2022-01-16 MED ORDER — METHYLPREDNISOLONE SODIUM SUCC 125 MG IJ SOLR
125.0000 mg | Freq: Once | INTRAMUSCULAR | Status: AC
  Administered 2022-01-16: 125 mg via INTRAVENOUS
  Filled 2022-01-16: qty 2

## 2022-01-16 MED ORDER — ALBUTEROL SULFATE HFA 108 (90 BASE) MCG/ACT IN AERS: 2.0000 | INHALATION_SPRAY | RESPIRATORY_TRACT | Status: DC | PRN

## 2022-01-16 MED ORDER — IPRATROPIUM-ALBUTEROL 0.5-2.5 (3) MG/3ML IN SOLN
3.0000 mL | Freq: Once | RESPIRATORY_TRACT | Status: AC
  Administered 2022-01-16: 3 mL via RESPIRATORY_TRACT
  Filled 2022-01-16: qty 3

## 2022-01-16 MED ORDER — HYDRALAZINE HCL 20 MG/ML IJ SOLN
10.0000 mg | Freq: Once | INTRAMUSCULAR | Status: AC
  Administered 2022-01-16: 10 mg via INTRAVENOUS
  Filled 2022-01-16: qty 1

## 2022-01-16 NOTE — ED Triage Notes (Signed)
Per son patient having increased SHOB for 2 days. Patient states productive cough of green sputum. Smoker.  ?

## 2022-01-16 NOTE — ED Notes (Signed)
Patient placed on portable oxygen tank at 2L/[place] ?

## 2022-01-16 NOTE — ED Provider Notes (Signed)
?[place] [facility] DEPARTMENT ?Provider Note ? ? ?CSN: [id number] ?Arrival date & time: 01/16/22  1725 ? ?  ? ?History ? ?Chief Complaint  ?Patient presents with  ? Shortness of Breath  ? ? ?Madeline Branch is a 81 y.o. female. ? ?Patient brought in by family member.  Patient with increasing shortness of breath for the past 2 days of productive cough of green sputum patient is a smoker.  Denies having COPD.  No fevers.  No abdominal pain no nausea or vomiting.  No chest pain.  Oxygen saturations on arrival were 89%.  Patient not on oxygen at home.  Past medical history significant for high cholesterol hypertension hypothyroidism atrial fibrillation and posttraumatic stress disorder.  Patient is not on a blood thinner. ? ? ?  ? ?Home Medications ?Prior to Admission medications   ?Medication Sig Start Date End Date Taking? Authorizing Provider  ?bisoprolol-hydrochlorothiazide (ZIAC) 10-6.25 MG tablet Take 1 tablet by mouth daily. 01/09/22  Yes [provider]  ?diphenoxylate-atropine (LOMOTIL) 2.5-0.025 MG per tablet Take by mouth 4 (four) times daily as needed for diarrhea or loose stools. As directed for diarrhea   Yes [provider]  ?fenofibrate (TRICOR) 145 MG tablet Take 145 mg by mouth daily.   Yes [provider]  ?levothyroxine (SYNTHROID) 300 MCG tablet Take 300 mcg by mouth daily. 01/09/22  Yes [provider]  ?levothyroxine (SYNTHROID) 75 MCG tablet Take 75 mcg by mouth daily. 12/22/21  Yes [provider]  ?sertraline (ZOLOFT) 25 MG tablet Take 25 mg by mouth daily. 01/09/22  Yes [provider]  ?simvastatin (ZOCOR) 10 MG tablet Take 10 mg by mouth daily. 11/13/21  Yes [provider]  ?Vitamin D, Ergocalciferol, (DRISDOL) 1.25 MG (50000 UNIT) CAPS capsule Take 50,000 Units by mouth once a week. 12/21/21  Yes [provider]  ?dicyclomine (BENTYL) 10 MG capsule TAKE 1 CAPSULE BY MOUTH FOUR TIMES DAILY(BEFORE MEALS AND AT BEDTIME) AS  NEEDED FOR ABDOMNIAL CRAMP OR DIARRHEA ?Patient not taking: Reported on 01/16/2022 01/09/18   Tiffany Kocher, PA-C  ?dicyclomine (BENTYL) 10 MG capsule TAKE 1 CAPSULE BY MOUTH FOUR TIMES DAILY( BEFORE MEALS& AT BEDTIME) AS NEEDED FOR ABDOMINAL CRAMP OR DIARRHEA ?Patient not taking: Reported on 01/16/2022 06/25/18   Anice Paganini, NP  ?pantoprazole (PROTONIX) 40 MG tablet TAKE 1 TABLET BY MOUTH EVERY DAY 30 MINUTES PRIOR TO BREAKFAST ?Patient not taking: Reported on 01/16/2022 07/07/16   Tiffany Kocher, PA-C  ?pantoprazole (PROTONIX) 40 MG tablet TAKE 1 TABLET BY MOUTH EVERY DAY 30 MINUTES BEFORE BREAKFAST ?Patient not taking: Reported on 01/16/2022 09/18/19   Gelene Mink, NP  ?   ? ?Allergies    ?Asa [aspirin] and Penicillins   ? ?Review of Systems   ?Review of Systems  ?Constitutional:  Negative for chills and fever.  ?HENT:  Negative for ear pain and sore throat.   ?Eyes:  Negative for pain and visual disturbance.  ?Respiratory:  Positive for shortness of breath and wheezing. Negative for cough.   ?Cardiovascular:  Negative for chest pain, palpitations and leg swelling.  ?Gastrointestinal:  Negative for abdominal pain and vomiting.  ?Genitourinary:  Negative for dysuria and hematuria.  ?Musculoskeletal:  Negative for arthralgias and back pain.  ?Skin:  Negative for color change and rash.  ?Neurological:  Negative for seizures and syncope.  ?All other systems reviewed and are negative. ? ?Physical Exam ?Updated Vital Signs ?BP (!) 185/65   Pulse 67   Temp 97.8 ?  F (36.6 ?C) (Oral)   Resp (!) 33   Ht 1.6 m (5\' 3" )   Wt 59 kg   SpO2 (!) 88%   BMI 23.03 kg/m?  ?Physical Exam ?Vitals and nursing note reviewed.  ?Constitutional:   ?   General: She is in acute distress.  ?   Appearance: She is well-developed. She is not diaphoretic.  ?HENT:  ?   Head: Normocephalic and atraumatic.  ?   Mouth/Throat:  ?   Mouth: Mucous membranes are moist.  ?Eyes:  ?   Conjunctiva/sclera: Conjunctivae normal.  ?Cardiovascular:  ?   Rate  and Rhythm: Normal rate and regular rhythm.  ?   Heart sounds: No murmur heard. ?Pulmonary:  ?   Effort: Tachypnea and respiratory distress present.  ?   Breath sounds: Wheezing and rhonchi present. No decreased breath sounds or rales.  ?Chest:  ?   Chest wall: No tenderness.  ?Abdominal:  ?   Palpations: Abdomen is soft.  ?   Tenderness: There is no abdominal tenderness.  ?Musculoskeletal:     ?   General: No swelling.  ?   Cervical back: Neck supple.  ?   Right lower leg: No edema.  ?Skin: ?   General: Skin is warm and dry.  ?   Capillary Refill: Capillary refill takes less than 2 seconds.  ?Neurological:  ?   General: No focal deficit present.  ?   Mental Status: She is alert and oriented to person, place, and time.  ?Psychiatric:     ?   Mood and Affect: Mood normal.  ? ? ?ED Results / Procedures / Treatments   ?Labs ?(all labs ordered are listed, but only abnormal results are displayed) ?Labs Reviewed  ?BASIC METABOLIC PANEL - Abnormal; Notable for the following components:  ?    Result Value  ? Glucose, Bld 110 (*)   ? Calcium 8.8 (*)   ? All other components within normal limits  ?CBC - Abnormal; Notable for the following components:  ? Platelets 146 (*)   ? All other components within normal limits  ?BRAIN NATRIURETIC PEPTIDE - Abnormal; Notable for the following components:  ? B Natriuretic Peptide 478.0 (*)   ? All other components within normal limits  ?TROPONIN I (HIGH SENSITIVITY) - Abnormal; Notable for the following components:  ? Troponin I (High Sensitivity) 20 (*)   ? All other components within normal limits  ?TROPONIN I (HIGH SENSITIVITY) - Abnormal; Notable for the following components:  ? Troponin I (High Sensitivity) 20 (*)   ? All other components within normal limits  ?RESP PANEL BY RT-PCR (FLU A&B, COVID) ARPGX2  ?BLOOD GAS, VENOUS  ? ? ?EKG ?None ? ?Radiology ?DG Chest 2 View ? ?Result Date: 01/16/2022 ?CLINICAL DATA:  Shortness of breath for 2 days, cough productive of green sputum. EXAM:  CHEST - 2 VIEW COMPARISON:  Chest radiograph 04/27/2005 FINDINGS: The cardiomediastinal silhouette is within normal limits The lungs hyperinflated with flattening of the diaphragms suggestive of underlying COPD. There are coarsened interstitial markings throughout both lungs, likely chronic in nature. There is no focal airspace disease. There is no pulmonary edema. There is no pleural effusion or pneumothorax. There is no acute osseous abnormality. IMPRESSION: 1. Hyperinflated lungs suggesting underlying COPD with coarsened interstitial markings throughout both lungs favored chronic. 2. No focal airspace disease or pleural effusion. Electronically Signed   By: 04/29/2005 M.D.   On: 01/16/2022 18:18   ? ?Procedures ?Procedures  ? ? ?Medications  Ordered in ED ?Medications  ?albuterol (VENTOLIN HFA) 108 (90 Base) MCG/ACT inhaler 2 puff (has no administration in time range)  ?methylPREDNISolone sodium succinate (SOLU-MEDROL) 125 mg/2 mL injection 125 mg (125 mg Intravenous Given 01/16/22 1939)  ?ipratropium-albuterol (DUONEB) 0.5-2.5 (3) MG/3ML nebulizer solution 3 mL (3 mLs Nebulization Given 01/16/22 1918)  ? ? ?ED Course/ Medical Decision Making/ A&P ?  ?                        ?Medical Decision Making ?Amount and/or Complexity of Data Reviewed ?Labs: ordered. ?Radiology: ordered. ? ?Risk ?Prescription drug management. ? ? ?Patient with COPD exacerbation no known history of COPD.  Nothing consistent with congestive heart failure.  Even though BNP is elevated but no leg swelling.  Patient had marked wheezing throughout significantly improved with DuoNeb and Solu-Medrol.  However off oxygen patient desats down to 87%.  On 2 L of oxygen she stays in the low 90s.  Venous blood gas without any significant abnormalities.  Troponins x2 were 20 and unchanged.  Basic metabolic panel without significant abnormalities other than calcium being a little low at 8.8.  CBC no leukocytosis hemoglobin 13.3.  COVID and influenza  testing pending.  Chest x-ray showed hyper of inflated lung suggesting underlying COPD throughout both lungs favoring chronic COPD no focal airspace disease. ? ?Patient arrived with significant wheezing. ? ?Was hypox

## 2022-01-17 ENCOUNTER — Inpatient Hospital Stay (HOSPITAL_COMMUNITY): Payer: Medicare Other

## 2022-01-17 DIAGNOSIS — E079 Disorder of thyroid, unspecified: Secondary | ICD-10-CM

## 2022-01-17 DIAGNOSIS — F32A Depression, unspecified: Secondary | ICD-10-CM

## 2022-01-17 DIAGNOSIS — I10 Essential (primary) hypertension: Secondary | ICD-10-CM

## 2022-01-17 DIAGNOSIS — K219 Gastro-esophageal reflux disease without esophagitis: Secondary | ICD-10-CM

## 2022-01-17 DIAGNOSIS — J441 Chronic obstructive pulmonary disease with (acute) exacerbation: Secondary | ICD-10-CM

## 2022-01-17 DIAGNOSIS — R0602 Shortness of breath: Secondary | ICD-10-CM

## 2022-01-17 DIAGNOSIS — J9601 Acute respiratory failure with hypoxia: Secondary | ICD-10-CM

## 2022-01-17 DIAGNOSIS — F17200 Nicotine dependence, unspecified, uncomplicated: Secondary | ICD-10-CM

## 2022-01-17 DIAGNOSIS — R778 Other specified abnormalities of plasma proteins: Secondary | ICD-10-CM | POA: Diagnosis not present

## 2022-01-17 DIAGNOSIS — E785 Hyperlipidemia, unspecified: Secondary | ICD-10-CM

## 2022-01-17 LAB — CBC WITH DIFFERENTIAL/PLATELET
Abs Immature Granulocytes: 0.01 10*3/uL (ref 0.00–0.07)
Basophils Absolute: 0 10*3/uL (ref 0.0–0.1)
Basophils Relative: 0 %
Eosinophils Absolute: 0 10*3/uL (ref 0.0–0.5)
Eosinophils Relative: 0 %
HCT: 39.2 % (ref 36.0–46.0)
Hemoglobin: 12.5 g/dL (ref 12.0–15.0)
Immature Granulocytes: 0 %
Lymphocytes Relative: 8 %
Lymphs Abs: 0.3 10*3/uL — ABNORMAL LOW (ref 0.7–4.0)
MCH: 28 pg (ref 26.0–34.0)
MCHC: 31.9 g/dL (ref 30.0–36.0)
MCV: 87.7 fL (ref 80.0–100.0)
Monocytes Absolute: 0.1 10*3/uL (ref 0.1–1.0)
Monocytes Relative: 3 %
Neutro Abs: 3.2 10*3/uL (ref 1.7–7.7)
Neutrophils Relative %: 89 %
Platelets: 122 10*3/uL — ABNORMAL LOW (ref 150–400)
RBC: 4.47 MIL/uL (ref 3.87–5.11)
RDW: 13 % (ref 11.5–15.5)
WBC: 3.6 10*3/uL — ABNORMAL LOW (ref 4.0–10.5)
nRBC: 0 % (ref 0.0–0.2)

## 2022-01-17 LAB — MAGNESIUM: Magnesium: 1.8 mg/dL (ref 1.7–2.4)

## 2022-01-17 LAB — ECHOCARDIOGRAM COMPLETE
AR max vel: 2.05 cm2
AV Area VTI: 1.87 cm2
AV Area mean vel: 1.9 cm2
AV Mean grad: 6 mmHg
AV Peak grad: 11.7 mmHg
Ao pk vel: 1.71 m/s
Area-P 1/2: 3.11 cm2
Calc EF: 65.3 %
Height: 63 in
MV VTI: 1.95 cm2
S' Lateral: 2.7 cm
Single Plane A2C EF: 64.3 %
Single Plane A4C EF: 67.6 %
Weight: 2080 oz

## 2022-01-17 LAB — COMPREHENSIVE METABOLIC PANEL
ALT: 17 U/L (ref 0–44)
AST: 26 U/L (ref 15–41)
Albumin: 3.7 g/dL (ref 3.5–5.0)
Alkaline Phosphatase: 33 U/L — ABNORMAL LOW (ref 38–126)
Anion gap: 9 (ref 5–15)
BUN: 20 mg/dL (ref 8–23)
CO2: 23 mmol/L (ref 22–32)
Calcium: 8.8 mg/dL — ABNORMAL LOW (ref 8.9–10.3)
Chloride: 108 mmol/L (ref 98–111)
Creatinine, Ser: 0.95 mg/dL (ref 0.44–1.00)
GFR, Estimated: >60 mL/min (ref >60)
Glucose, Bld: 139 mg/dL — ABNORMAL HIGH (ref 70–99)
Potassium: 3.6 mmol/L (ref 3.5–5.1)
Sodium: 140 mmol/L (ref 135–145)
Total Bilirubin: 0.3 mg/dL (ref 0.3–1.2)
Total Protein: 6.6 g/dL (ref 6.5–8.1)

## 2022-01-17 MED ORDER — BISOPROLOL-HYDROCHLOROTHIAZIDE 10-6.25 MG PO TABS
1.0000 | ORAL_TABLET | Freq: Every day | ORAL | Status: DC
  Filled 2022-01-17 (×2): qty 1

## 2022-01-17 MED ORDER — MOMETASONE FURO-FORMOTEROL FUM 200-5 MCG/ACT IN AERO
2.0000 | INHALATION_SPRAY | Freq: Two times a day (BID) | RESPIRATORY_TRACT | Status: DC
  Administered 2022-01-17 – 2022-01-24 (×14): 2 via RESPIRATORY_TRACT
  Filled 2022-01-17 (×2): qty 8.8

## 2022-01-17 MED ORDER — METHYLPREDNISOLONE SODIUM SUCC 125 MG IJ SOLR
125.0000 mg | Freq: Two times a day (BID) | INTRAMUSCULAR | Status: AC
  Administered 2022-01-17 (×2): 125 mg via INTRAVENOUS
  Filled 2022-01-17 (×2): qty 2

## 2022-01-17 MED ORDER — SERTRALINE HCL 50 MG PO TABS
25.0000 mg | ORAL_TABLET | Freq: Every day | ORAL | Status: DC
  Administered 2022-01-17 – 2022-01-24 (×8): 25 mg via ORAL
  Filled 2022-01-17 (×8): qty 1

## 2022-01-17 MED ORDER — PREDNISONE 20 MG PO TABS
40.0000 mg | ORAL_TABLET | Freq: Every day | ORAL | Status: DC
  Administered 2022-01-18: 40 mg via ORAL
  Filled 2022-01-17: qty 2

## 2022-01-17 MED ORDER — BISOPROLOL FUMARATE 5 MG PO TABS
10.0000 mg | ORAL_TABLET | Freq: Every day | ORAL | Status: DC
  Administered 2022-01-17 – 2022-01-24 (×8): 10 mg via ORAL
  Filled 2022-01-17 (×8): qty 2

## 2022-01-17 MED ORDER — FENOFIBRATE 54 MG PO TABS
54.0000 mg | ORAL_TABLET | Freq: Every day | ORAL | Status: DC
  Administered 2022-01-17 – 2022-01-24 (×8): 54 mg via ORAL
  Filled 2022-01-17 (×9): qty 1

## 2022-01-17 MED ORDER — NICOTINE 21 MG/24HR TD PT24
21.0000 mg | MEDICATED_PATCH | Freq: Every day | TRANSDERMAL | Status: DC
  Administered 2022-01-17 – 2022-01-24 (×8): 21 mg via TRANSDERMAL
  Filled 2022-01-17 (×8): qty 1

## 2022-01-17 MED ORDER — ACETAMINOPHEN 650 MG RE SUPP: 650.0000 mg | Freq: Four times a day (QID) | RECTAL | Status: DC | PRN

## 2022-01-17 MED ORDER — ONDANSETRON HCL 4 MG PO TABS: 4.0000 mg | ORAL_TABLET | Freq: Four times a day (QID) | ORAL | Status: DC | PRN

## 2022-01-17 MED ORDER — HYDRALAZINE HCL 25 MG PO TABS
25.0000 mg | ORAL_TABLET | Freq: Four times a day (QID) | ORAL | Status: DC | PRN
  Administered 2022-01-17: 25 mg via ORAL
  Filled 2022-01-17: qty 1

## 2022-01-17 MED ORDER — MORPHINE SULFATE (PF) 2 MG/ML IV SOLN
2.0000 mg | INTRAVENOUS | Status: DC | PRN
  Administered 2022-01-17 – 2022-01-23 (×6): 2 mg via INTRAVENOUS
  Filled 2022-01-17 (×6): qty 1

## 2022-01-17 MED ORDER — HEPARIN SODIUM (PORCINE) 5000 UNIT/ML IJ SOLN: 5000.0000 [IU] | Freq: Three times a day (TID) | INTRAMUSCULAR | Status: DC

## 2022-01-17 MED ORDER — ACETAMINOPHEN 325 MG PO TABS: 650.0000 mg | ORAL_TABLET | Freq: Four times a day (QID) | ORAL | Status: DC | PRN

## 2022-01-17 MED ORDER — PANTOPRAZOLE SODIUM 40 MG PO TBEC
40.0000 mg | DELAYED_RELEASE_TABLET | Freq: Every day | ORAL | Status: DC
  Administered 2022-01-17 – 2022-01-24 (×8): 40 mg via ORAL
  Filled 2022-01-17 (×8): qty 1

## 2022-01-17 MED ORDER — ONDANSETRON HCL 4 MG/2ML IJ SOLN
4.0000 mg | Freq: Four times a day (QID) | INTRAMUSCULAR | Status: DC | PRN
Start: 2022-01-17 — End: 2022-01-24

## 2022-01-17 MED ORDER — HYDROCHLOROTHIAZIDE 12.5 MG PO TABS
6.2500 mg | ORAL_TABLET | Freq: Every day | ORAL | Status: DC
  Administered 2022-01-17 – 2022-01-24 (×8): 6.25 mg via ORAL
  Filled 2022-01-17 (×8): qty 1

## 2022-01-17 MED ORDER — SIMVASTATIN 20 MG PO TABS
10.0000 mg | ORAL_TABLET | Freq: Every day | ORAL | Status: DC
  Administered 2022-01-17 – 2022-01-24 (×8): 10 mg via ORAL
  Filled 2022-01-17 (×8): qty 1

## 2022-01-17 MED ORDER — LEVOTHYROXINE SODIUM 100 MCG PO TABS
300.0000 ug | ORAL_TABLET | Freq: Every day | ORAL | Status: DC
  Administered 2022-01-17 – 2022-01-24 (×8): 300 ug via ORAL
  Filled 2022-01-17: qty 6
  Filled 2022-01-17 (×7): qty 3

## 2022-01-17 MED ORDER — HEPARIN SODIUM (PORCINE) 5000 UNIT/ML IJ SOLN
5000.0000 [IU] | Freq: Three times a day (TID) | INTRAMUSCULAR | Status: DC
  Administered 2022-01-17 – 2022-01-24 (×22): 5000 [IU] via SUBCUTANEOUS
  Filled 2022-01-17 (×22): qty 1

## 2022-01-17 MED ORDER — OXYCODONE HCL 5 MG PO TABS
5.0000 mg | ORAL_TABLET | ORAL | Status: DC | PRN
  Administered 2022-01-18 – 2022-01-24 (×15): 5 mg via ORAL
  Filled 2022-01-17 (×16): qty 1

## 2022-01-17 MED ORDER — METOPROLOL TARTRATE 5 MG/5ML IV SOLN
5.0000 mg | Freq: Once | INTRAVENOUS | Status: AC
  Administered 2022-01-17: 5 mg via INTRAVENOUS
  Filled 2022-01-17: qty 5

## 2022-01-17 MED ORDER — IPRATROPIUM-ALBUTEROL 0.5-2.5 (3) MG/3ML IN SOLN
3.0000 mL | Freq: Four times a day (QID) | RESPIRATORY_TRACT | Status: DC
  Administered 2022-01-17 – 2022-01-18 (×8): 3 mL via RESPIRATORY_TRACT
  Filled 2022-01-17 (×7): qty 3

## 2022-01-17 NOTE — Assessment & Plan Note (Signed)
Continue Synthroid °

## 2022-01-17 NOTE — Assessment & Plan Note (Signed)
-   Continue Ziac ?

## 2022-01-17 NOTE — Assessment & Plan Note (Signed)
-   Secondary to COPD exacerbation ?-Troponin flat at 20 ?-Chest x-ray shows signs of COPD with chronic changes ?-Oxygen sats down to 80s on room air ?-At first requiring 4 L nasal cannula to maintain oxygen sats, now requiring 2 L nasal cannula ?-Continue treatment for COPD and wean off oxygen as tolerated ? ?

## 2022-01-17 NOTE — TOC Progression Note (Signed)
Transition of Care (TOC) - Progression Note  ? ? ?Patient Details  ?Name: Madeline Branch ?MRN: [id number] ?Date of Birth: [birth year] ? ?Transition of Care (TOC) CM/SW Contact  ?Karn Cassis, LCSW ?Phone Number: ?01/17/2022, 11:12 AM ? ?Clinical Narrative:    ?Transition of Care (TOC) Screening Note ? ? ?Patient Details  ?Name: Madeline Branch ?Date of Birth: 11/11/1940 ? ? ?Transition of Care (TOC) CM/SW Contact:    ?Karn Cassis, LCSW ?Phone Number: ?01/17/2022, 11:12 AM ? ? ? ?Transition of Care Department [facility]) has reviewed patient and no TOC needs have been identified at this time. We will continue to monitor patient advancement through interdisciplinary progression rounds. If new patient transition needs arise, please place a TOC consult. ?  ? ? ? ?  ?Barriers to Discharge: Continued Medical Work up ? ?Expected Discharge Plan and Services ?  ?  ?  ?  ?  ?                ?  ?  ?  ?  ?  ?  ?  ?  ?  ?  ? ? ?Social Determinants of Health (SDOH) Interventions ?  ? ?Readmission Risk Interventions ?   ? View : No data to display.  ?  ?  ?  ? ? ?

## 2022-01-17 NOTE — Progress Notes (Addendum)
PROGRESS NOTE ? ?Brief Narrative: ?Madeline Branch is a 81 y.o. female with a history of AFib, HTN, HLD, hypothyroidism, tobacco use who presented to the ED with progressive dyspnea, increase in sputum/cough. She has no history of COPD, though CXR showed hyperinflated lungs with coarsened interstitial markings favored to be chronic. She was tachypneic and hypoxic, admitted this morning by Dr. Carren Rang for COPD exacerbation.  ? ?Subjective: ?Breathing is definitely better this morning compared to last night. Still short of breath at rest. Tired of sitting in ED stretcher all night. No chest pain.  ? ?Objective: ?BP (!) 152/108   Pulse 67   Temp 97.8 ?F (36.6 ?C) (Oral)   Resp (!) 22   Ht 5\' 3"  (1.6 m)   Wt 59 kg   SpO2 97%   BMI 23.03 kg/m?   ?Gen: Pleasant elderly female in no acute distress ?Pulm: Mildly labored with supplemental oxygen during conversation or readjusting in bed. Diminished with expiratory wheezes bilaterally.  ?CV: RRR, no murmur, no JVD, no edema ?GI: Soft, NT, ND, +BS  ?Neuro: Alert and oriented. No focal deficits. ?Skin: No rashes, lesions or ulcers ? ?Assessment & Plan: ?81 y.o. female admitted for COPD exacerbation. Improving on IV steroids, nebs. Still tight/wheezing on exam, so will continue IV steroids and hopefully wean from oxygen in the next 24 hours. She also had elevated BNP with interstitial prominence on CXR. While she's not showing clinical evidence of peripheral or pulmonary volume overload, will check echo. Will need formal PFTs at follow up. We discussed need for smoking cessation, nicotine patch ordered. I also confirmed she's on 70 synthroid at home. BPs elevated, reordered home antihypertensives, add prn hydralazine. Please see H&P from this morning for full plan details. ? ? , MD ?Pager on amion ?01/17/2022, 11:59 AM   ?

## 2022-01-17 NOTE — Assessment & Plan Note (Signed)
Continue Zoloft 

## 2022-01-17 NOTE — H&P (Signed)
?History and Physical  ? ? ?Patient: Madeline Branch GEZ:[id number] DOB: April 11, 1941 ?DOA: 01/16/2022 ?DOS: the patient was seen and examined on 01/17/2022 ?PCP: Pcp, No  ?Patient coming from: Home ? ?Chief Complaint:  ?Chief Complaint  ?Patient presents with  ? Shortness of Breath  ? ?HPI: Madeline Branch is a 81 y.o. female with medical history significant of atrial fibrillation, hyperlipidemia, hypertension, hypothyroidism, PTSD, presents to the ED with a chief complaint of shortness of breath.  It was gradual in onset and started 3 days ago.  Became progressively worse over the last 2 days.  She has associated cough with clear and green sputum.  She has no fever, no chest pain.  She does admit to palpitations.  Her dyspnea and palpitations are nonexertional per her report.  She reports associated stomach muscle wall soreness from coughing.  She describes a pressure sensation in her chest.  Laying down makes that sensation worse.  Sitting up makes it better.  Patient is a current smoker 1 pack/day.  Patient has no known history of COPD.  She has no other complaints at this time. ? ?Patient is a current smoker, does not drink alcohol, does not use illicit drugs, is vaccinated for COVID.  Patient is full code. ?Review of Systems: As mentioned in the history of present illness. All other systems reviewed and are negative. ?Past Medical History:  ?Diagnosis Date  ? Arthritis   ? Atrial fibrillation (HCC)   ? Hypercholesterolemia   ? Hypertension   ? Hypothyroidism   ? PTSD (post-traumatic stress disorder)   ? ?Past Surgical History:  ?Procedure Laterality Date  ? ABDOMINAL HYSTERECTOMY    ? CARPAL TUNNEL RELEASE    ? cataracts    ? COLONOSCOPY N/A 04/23/2014  ? Dr. Rourk:Normal ileocolonoscopy-status post segmental biopsy. Normal path.   ? ESOPHAGOGASTRODUODENOSCOPY N/A 04/23/2014  ? Dr. Rourk:Normal esophagus-status post passage of a [facility] dilator. Duodenal bulbar erosions-status post biopsy. Mild chronic inactive gastritis   ? MALONEY DILATION N/A 04/23/2014  ? Procedure: MALONEY DILATION;  Surgeon: Corbin Ade, MD;  Location: [facility];  Service: Endoscopy;  Laterality: N/A;  ? SAVORY DILATION N/A 04/23/2014  ? Procedure: SAVORY DILATION;  Surgeon: Corbin Ade, MD;  Location: [facility];  Service: Endoscopy;  Laterality: N/A;  ? ?Social History:  reports that she has been smoking cigarettes. She has been smoking an average of 1 pack per day. She has never used smokeless tobacco. She reports that she does not drink alcohol and does not use drugs. ? ?Allergies  ?Allergen Reactions  ? Asa [Aspirin] Anaphylaxis  ?  Irritates stomach and gets ringing in her ears  ? Penicillins Itching  ? ? ?Family History  ?Problem Relation Age of Onset  ? Colon cancer Neg Hx   ? ? ?Prior to Admission medications   ?Medication Sig Start Date End Date Taking? Authorizing Provider  ?bisoprolol-hydrochlorothiazide (ZIAC) 10-6.25 MG tablet Take 1 tablet by mouth daily. 01/09/22  Yes [provider]  ?diphenoxylate-atropine (LOMOTIL) 2.5-0.025 MG per tablet Take by mouth 4 (four) times daily as needed for diarrhea or loose stools. As directed for diarrhea   Yes [provider]  ?fenofibrate (TRICOR) 145 MG tablet Take 145 mg by mouth daily.   Yes [provider]  ?levothyroxine (SYNTHROID) 300 MCG tablet Take 300 mcg by mouth daily. 01/09/22  Yes [provider]  ?levothyroxine (SYNTHROID) 75 MCG tablet Take 75 mcg by mouth daily. 12/22/21  Yes [provider]  ?  sertraline (ZOLOFT) 25 MG tablet Take 25 mg by mouth daily. 01/09/22  Yes [provider]  ?simvastatin (ZOCOR) 10 MG tablet Take 10 mg by mouth daily. 11/13/21  Yes [provider]  ?Vitamin D, Ergocalciferol, (DRISDOL) 1.25 MG (50000 UNIT) CAPS capsule Take 50,000 Units by mouth once a week. 12/21/21  Yes [provider]  ?dicyclomine (BENTYL) 10 MG capsule TAKE 1 CAPSULE BY MOUTH FOUR TIMES DAILY(BEFORE MEALS AND AT BEDTIME)  AS NEEDED FOR ABDOMNIAL CRAMP OR DIARRHEA ?Patient not taking: Reported on 01/16/2022 01/09/18   Tiffany Kocher, PA-C  ?dicyclomine (BENTYL) 10 MG capsule TAKE 1 CAPSULE BY MOUTH FOUR TIMES DAILY( BEFORE MEALS& AT BEDTIME) AS NEEDED FOR ABDOMINAL CRAMP OR DIARRHEA ?Patient not taking: Reported on 01/16/2022 06/25/18   Anice Paganini, NP  ?pantoprazole (PROTONIX) 40 MG tablet TAKE 1 TABLET BY MOUTH EVERY DAY 30 MINUTES PRIOR TO BREAKFAST ?Patient not taking: Reported on 01/16/2022 07/07/16   Tiffany Kocher, PA-C  ?pantoprazole (PROTONIX) 40 MG tablet TAKE 1 TABLET BY MOUTH EVERY DAY 30 MINUTES BEFORE BREAKFAST ?Patient not taking: Reported on 01/16/2022 09/18/19   Gelene Mink, NP  ? ? ?Physical Exam: ?Vitals:  ? 01/16/22 2230 01/16/22 2300 01/17/22 0110 01/17/22 0230  ?BP:  (!) 170/63  (!) 144/57  ?Pulse: 67 64  72  ?Resp: (!) 33 19  (!) 30  ?Temp:      ?TempSrc:      ?SpO2: (!) 88% 97% 99% 97%  ?Weight:      ?Height:      ? ?1.  General: ?Patient lying supine in bed, head of bed elevated, no acute distress ?  ?2. Psychiatric: ?Alert and oriented x 3, mood and behavior normal for situation, pleasant and cooperative with exam ?  ?3. Neurologic: ?Speech and language are normal, face is symmetric, moves all 4 extremities voluntarily, at baseline without acute deficits on limited exam ?  ?4. HEENMT:  ?Head is atraumatic, normocephalic, pupils reactive to light, neck is supple, trachea is midline, mucous membranes are moist ?  ?5. Respiratory : ?Diffuse wheezing without rhonchi, rales, no cyanosis, no increase in work of breathing or accessory muscle use ?  ?6. Cardiovascular : ?Heart rate normal, rhythm is regular, no murmurs, rubs or gallops, no peripheral edema, peripheral pulses palpated ?  ?7. Gastrointestinal:  ?Abdomen is soft, nondistended, nontender to palpation bowel sounds active, no masses or organomegaly palpated ?  ?8. Skin:  ?Skin is warm, dry and intact without rashes, acute lesions, or ulcers on limited exam ?   ?9.Musculoskeletal:  ?No acute deformities or trauma, no asymmetry in tone, no peripheral edema, peripheral pulses palpated, no tenderness to palpation in the extremities ? ?Data Reviewed: ?In the ED ?Temp 97.8, heart rate 67-80, respiratory rate 19-34, blood pressure 185/65 but as high as 236/86, satting 88 to 97% ?pH 7.35, PCO2 47 ?White blood cell count 4.9, hemoglobin stable at 13.3 ?Chemistry is unremarkable ?Trope 28x2 ?BNP 478 ?Chest x-ray shows hyperinflated lungs suggesting underlying COPD with coarsened interstitial markings throughout both lungs that is favored to be chronic ?Patient was given Solu-Medrol, DuoNebs, started on oxygen supplementation, and admission is requested for COPD exacerbation ?Assessment and Plan: ?* COPD exacerbation (HCC) ?- No known history of COPD ?-Current smoker ?-Hyperinflated lungs on chest x-ray ?-Presents with dyspnea, cough, wheeze, green sputum ?-O2 sats in the 80s on room air requiring 4 L nasal cannula ?-DuoNebs and Solu-Medrol given in the ED ?-Patient feels improved but is not back  to room air ?-Continue as needed albuterol, scheduled DuoNebs, systemic steroids, start Dulera ?-Continue to monitor ? ?Hyperlipidemia ?- Fenofibrate and statin ? ?Thyroid disease ?- Continue Synthroid ? ?Depression ?- Continue Zoloft ? ?Essential hypertension ?- Continue Ziac ? ?Tobacco use disorder ?- Counseled on the importance of cessation especially with the new diagnosis of COPD ?-Nicotine patch ordered ? ? ?Acute respiratory failure with hypoxia (HCC) ?- Secondary to COPD exacerbation ?-Troponin flat at 20 ?-Chest x-ray shows signs of COPD with chronic changes ?-Oxygen sats down to 80s on room air ?-At first requiring 4 L nasal cannula to maintain oxygen sats, now requiring 2 L nasal cannula ?-Continue treatment for COPD and wean off oxygen as tolerated ? ? ?GERD (gastroesophageal reflux disease) ?- Continue PPI ? ? ? ? ? Advance Care Planning:   Code Status: Full Code  ? ?Consults:  None ? ?Family Communication: No family at bedside ? ?Severity of Illness: ?The appropriate patient status for this patient is INPATIENT. Inpatient status is judged to be reasonable and necessary i

## 2022-01-17 NOTE — Progress Notes (Signed)
*  PRELIMINARY RESULTS* ?Echocardiogram ?2D Echocardiogram has been performed. ? ?Madeline Branch ?01/17/2022, 3:20 PM ?

## 2022-01-17 NOTE — Assessment & Plan Note (Signed)
-   No known history of COPD ?-Current smoker ?-Hyperinflated lungs on chest x-ray ?-Presents with dyspnea, cough, wheeze, green sputum ?-O2 sats in the 80s on room air requiring 4 L nasal cannula ?-DuoNebs and Solu-Medrol given in the ED ?-Patient feels improved but is not back to room air ?-Continue as needed albuterol, scheduled DuoNebs, systemic steroids, start Dulera ?-Continue to monitor ?

## 2022-01-17 NOTE — Progress Notes (Signed)
?   01/17/22 1703  ?Assess: MEWS Score  ?Temp 98 ?F (36.7 ?C)  ?BP (!) 187/61  ?Pulse Rate 75  ?Resp (!) 32  ?SpO2 98 %  ?O2 Device Nasal Cannula  ?O2 Flow Rate (L/min) 3 L/min  ?Assess: MEWS Score  ?MEWS Temp 0  ?MEWS Systolic 0  ?MEWS Pulse 0  ?MEWS RR 2  ?MEWS LOC 0  ?MEWS Score 2  ?MEWS Score Color Yellow  ? ? ?

## 2022-01-17 NOTE — Assessment & Plan Note (Signed)
-   Counseled on the importance of cessation especially with the new diagnosis of COPD ?-Nicotine patch ordered ? ?

## 2022-01-17 NOTE — Assessment & Plan Note (Signed)
Continue PPI ?

## 2022-01-17 NOTE — Assessment & Plan Note (Signed)
-   Fenofibrate and statin ?

## 2022-01-18 ENCOUNTER — Inpatient Hospital Stay (HOSPITAL_COMMUNITY): Payer: Medicare Other

## 2022-01-18 DIAGNOSIS — J441 Chronic obstructive pulmonary disease with (acute) exacerbation: Secondary | ICD-10-CM | POA: Diagnosis not present

## 2022-01-18 DIAGNOSIS — I248 Other forms of acute ischemic heart disease: Secondary | ICD-10-CM

## 2022-01-18 LAB — D-DIMER, QUANTITATIVE: D-Dimer, Quant: 0.5 ug/mL-FEU (ref 0.00–0.50)

## 2022-01-18 MED ORDER — AZITHROMYCIN 250 MG PO TABS
500.0000 mg | ORAL_TABLET | Freq: Every day | ORAL | Status: DC
  Administered 2022-01-18 – 2022-01-22 (×5): 500 mg via ORAL
  Filled 2022-01-18 (×5): qty 2

## 2022-01-18 MED ORDER — IPRATROPIUM-ALBUTEROL 0.5-2.5 (3) MG/3ML IN SOLN
3.0000 mL | Freq: Three times a day (TID) | RESPIRATORY_TRACT | Status: DC
  Administered 2022-01-19 – 2022-01-23 (×13): 3 mL via RESPIRATORY_TRACT
  Filled 2022-01-18 (×13): qty 3

## 2022-01-18 MED ORDER — METHYLPREDNISOLONE SODIUM SUCC 125 MG IJ SOLR
60.0000 mg | Freq: Every day | INTRAMUSCULAR | Status: DC
  Administered 2022-01-19 – 2022-01-20 (×2): 60 mg via INTRAVENOUS
  Filled 2022-01-18 (×2): qty 2

## 2022-01-18 NOTE — Progress Notes (Addendum)
?PROGRESS NOTE ? ? ? Madeline Branch  [id number] DOB: 10-14-1940 DOA: 01/16/2022 ?PCP: Pcp, No  ? ?  ?Brief Narrative:  ?Madeline Branch is a 81 y.o. female with medical history significant of atrial fibrillation, hyperlipidemia, hypertension, hypothyroidism, PTSD, presents to the ED with a chief complaint of shortness of breath.  It was gradual in onset and started 3 days ago.  Became progressively worse over the last 2 days.  She has associated cough with clear and green sputum.  She has no fever, no chest pain.  She does admit to palpitations.  Her dyspnea and palpitations are nonexertional per her report.  Patient is a current smoker 1 pack/day, has been smoking since she was 81 years old. She was admitted for COPD exacerbation.  ? ?New events last 24 hours / Subjective: ?Complains of left sided rib pain, under her left breast. Continues to have some SOB at rest. Remains on 3L [place] O2.  ? ?Assessment & Plan: ?  ? ?Principal Problem: ?  COPD exacerbation ([place]) ?Active Problems: ?  GERD (gastroesophageal reflux disease) ?  Acute respiratory failure with hypoxia ([place]) ?  Tobacco use disorder ?  Essential hypertension ?  Depression ?  Thyroid disease ?  Hyperlipidemia ?  Demand ischemia ([place]) ? ? ?Acute hypoxemic respiratory failure secondary to COPD exacerbation ?-CXR: Hyperinflated lungs suggesting underlying COPD with coarsened interstitial markings throughout both lungs favored chronic ?-Left rib xray negative for fracture. Check d dimer, patient continues to have respiratory failure, tachypnea, O2 requirement and left-sided chest pain. If d-dimer elevated, will rule out PE with CTA chest  ?-Solumedrol, breathing tx, azithromycin ?-Continue to wean oxygen as tolerated, maintain saturation >88% ? ?Hypothyroidism ?-Synthroid ? ?HTN ?-Bisoprolol, HCTZ  ? ?Depression ?-Zoloft ? ?HLD ?-Fenofibrate and zocor ? ?Tobacco abuse ?-Cessation counseling  ?-Nicotine patch  ? ?Demand ischemia ?-Flat troponin trend 20, 20 ?-Echo  without regional wall motion abnormalities ? ? ?DVT prophylaxis:  ?heparin injection 5,000 Units Start: 01/17/22 0600 ?SCDs Start: 01/17/22 0021 ? ?Code Status: Full - confirmed today  ?Family Communication: None at bedside ?Disposition Plan:  ?Status is: Inpatient ?Remains inpatient appropriate because: IV solumedrol, respiratory failure ? ?Antimicrobials:  ?Anti-infectives (From admission, onward)  ? ? Start     Dose/Rate Route Frequency Ordered Stop  ? 01/18/22 1345  azithromycin (ZITHROMAX) tablet 500 mg       ? 500 mg Oral Daily 01/18/22 1254    ? ?  ? ? ? ?Objective: ?Vitals:  ? 01/18/22 0122 01/18/22 [id number] 01/18/22 [id number] 01/18/22 0717  ?BP: (!) 171/65  (!) 161/61   ?Pulse: 84  80   ?Resp: 18  19   ?Temp: 98 ?F (36.7 ?C)  (!) 97 ?F (36.1 ?C)   ?TempSrc:      ?SpO2: 97% 98% 97% 97%  ?Weight:      ?Height:      ? ? ?Intake/Output Summary (Last 24 hours) at 01/18/2022 1255 ?Last data filed at 01/18/2022 0500 ?Gross per 24 hour  ?Intake --  ?Output 300 ml  ?Net -300 ml  ? ?Filed Weights  ? 01/16/22 1733  ?Weight: 59 kg  ? ? ?Examination:  ?General exam: Appears calm and comfortable  ?Respiratory system: Tachypneic, mild conversational dyspnea, on 3 L oxygen ?Cardiovascular system: S1 & S2 heard, RRR. No murmurs. No pedal edema. ?Gastrointestinal system: Abdomen is nondistended, soft and nontender. Normal bowel sounds heard. ?Central nervous system: Alert and oriented. No focal neurological deficits. Speech clear.  ?Extremities: Symmetric in appearance  ?  Skin: No rashes, lesions or ulcers on exposed skin  ?Psychiatry: Judgement and insight appear normal. Mood & affect appropriate.  ? ?Data Reviewed: I have personally reviewed following labs and imaging studies ? ?CBC: ?Recent Labs  ?Lab 01/16/22 ?1750 01/17/22 ?0326  ?WBC 4.9 3.6*  ?NEUTROABS  --  3.2  ?HGB 13.3 12.5  ?HCT 41.3 39.2  ?MCV 88.4 87.7  ?PLT 146* 122*  ? ?Basic Metabolic Panel: ?Recent Labs  ?Lab 01/16/22 ?1750 01/17/22 ?0326  ?NA 140 140  ?K 3.8 3.6  ?CL  108 108  ?CO2 24 23  ?GLUCOSE 110* 139*  ?BUN 18 20  ?CREATININE 0.88 0.95  ?CALCIUM 8.8* 8.8*  ?MG  --  1.8  ? ?GFR: ?Estimated Creatinine Clearance: 40.4 mL/min (by C-G formula based on SCr of 0.95 mg/dL). ?Liver Function Tests: ?Recent Labs  ?Lab 01/17/22 ?0326  ?AST 26  ?ALT 17  ?ALKPHOS 33*  ?BILITOT 0.3  ?PROT 6.6  ?ALBUMIN 3.7  ? ?No results for input(s): LIPASE, AMYLASE in the last 168 hours. ?No results for input(s): AMMONIA in the last 168 hours. ?Coagulation Profile: ?No results for input(s): INR, PROTIME in the last 168 hours. ?Cardiac Enzymes: ?No results for input(s): CKTOTAL, CKMB, CKMBINDEX, TROPONINI in the last 168 hours. ?BNP (last 3 results) ?No results for input(s): PROBNP in the last 8760 hours. ?HbA1C: ?No results for input(s): HGBA1C in the last 72 hours. ?CBG: ?No results for input(s): GLUCAP in the last 168 hours. ?Lipid Profile: ?No results for input(s): CHOL, HDL, LDLCALC, TRIG, CHOLHDL, LDLDIRECT in the last 72 hours. ?Thyroid Function Tests: ?No results for input(s): TSH, T4TOTAL, FREET4, T3FREE, THYROIDAB in the last 72 hours. ?Anemia Panel: ?No results for input(s): VITAMINB12, FOLATE, FERRITIN, TIBC, IRON, RETICCTPCT in the last 72 hours. ?Sepsis Labs: ?No results for input(s): PROCALCITON, LATICACIDVEN in the last 168 hours. ? ?No results found for this or any previous visit (from the past 240 hour(s)).  ? ? ?Radiology Studies: ?DG Chest 2 View ? ?Result Date: 01/16/2022 ?CLINICAL DATA:  Shortness of breath for 2 days, cough productive of green sputum. EXAM: CHEST - 2 VIEW COMPARISON:  Chest radiograph 04/27/2005 FINDINGS: The cardiomediastinal silhouette is within normal limits The lungs hyperinflated with flattening of the diaphragms suggestive of underlying COPD. There are coarsened interstitial markings throughout both lungs, likely chronic in nature. There is no focal airspace disease. There is no pulmonary edema. There is no pleural effusion or pneumothorax. There is no acute  osseous abnormality. IMPRESSION: 1. Hyperinflated lungs suggesting underlying COPD with coarsened interstitial markings throughout both lungs favored chronic. 2. No focal airspace disease or pleural effusion. Electronically Signed   By: Valetta Mole M.D.   On: 01/16/2022 18:18  ? ?DG Ribs Unilateral Left ? ?Result Date: 01/18/2022 ?CLINICAL DATA:  Left sided chest pain radiating like a band around to back. EXAM: LEFT RIBS - 2 VIEW COMPARISON:  Chest x-ray January 16, 2022. FINDINGS: No evidence a displaced left-sided rib fracture. Please see recent chest x-ray for characterization of the lungs. IMPRESSION: No evidence a displaced left-sided rib fracture. Electronically Signed   By: Margaretha Sheffield M.D.   On: 01/18/2022 11:39  ? ?ECHOCARDIOGRAM COMPLETE ? ?Result Date: 01/17/2022 ?   ECHOCARDIOGRAM REPORT   Patient Name:   ELOISA LEETE Date of Exam: 01/17/2022 Medical Rec #:  [id number]     Height:       63.0 in Accession #:    [id number]    Weight:  130.0 lb Date of Birth:  06/30/1941      BSA:          1.610 m? Patient Age:    89 years      BP:           189/77 mmHg Patient Gender: F             HR:           73 bpm. Exam Location:  [facility] Procedure: 2D Echo, Cardiac Doppler and Color Doppler Indications:    Elevated Troponin  History:        Patient has no prior history of Echocardiogram examinations.                 COPD; Risk Factors:Hypertension, Dyslipidemia and Current                 Smoker. Images by Lonn Georgia, student.  Sonographer:    Wenda Low Referring Phys: [place] Comments: Image acquisition challenging due to respiratory motion. IMPRESSIONS  1. Left ventricular ejection fraction, by estimation, is 70 to 75%. The left ventricle has normal function. The left ventricle has no regional wall motion abnormalities. Left ventricular diastolic parameters are consistent with Grade I diastolic dysfunction (impaired relaxation).  2. Right ventricular systolic function is normal. The  right ventricular size is normal. There is moderately elevated pulmonary artery systolic pressure.  3. The mitral valve is normal in structure. No evidence of mitral valve regurgitation. No evidence of

## 2022-01-19 DIAGNOSIS — J441 Chronic obstructive pulmonary disease with (acute) exacerbation: Secondary | ICD-10-CM | POA: Diagnosis not present

## 2022-01-19 NOTE — Progress Notes (Signed)
?PROGRESS NOTE ? ? ? CLARYSSA HILDE  [id number] DOB: 1941/05/07 DOA: 01/16/2022 ?PCP: Pcp, No  ? ?  ?Brief Narrative:  ?Madeline Branch is a 81 y.o. female with medical history significant of atrial fibrillation, hyperlipidemia, hypertension, hypothyroidism, PTSD, presents to the ED with a chief complaint of shortness of breath.  It was gradual in onset and started 3 days ago.  Became progressively worse over the last 2 days.  She has associated cough with clear and green sputum.  She has no fever, no chest pain.  She does admit to palpitations.  Her dyspnea and palpitations are nonexertional per her report.  Patient is a current smoker 1 pack/day, has been smoking since she was 81 years old. She was admitted for COPD exacerbation.  ? ?New events last 24 hours / Subjective: ?Her left-sided rib pain and shortness of breath is mildly improved.  Patient states that she was able to sit in a chair yesterday, but was not able to stand or walk much.  Remains very deconditioned. ? ?Assessment & Plan: ?  ? ?Principal Problem: ?  COPD exacerbation ([place]) ?Active Problems: ?  GERD (gastroesophageal reflux disease) ?  Acute respiratory failure with hypoxia ([place]) ?  Tobacco use disorder ?  Essential hypertension ?  Depression ?  Thyroid disease ?  Hyperlipidemia ?  Demand ischemia ([place]) ? ? ?Acute hypoxemic respiratory failure secondary to COPD exacerbation ?-CXR: Hyperinflated lungs suggesting underlying COPD with coarsened interstitial markings throughout both lungs favored chronic ?-Left rib xray negative for fracture ?-D-dimer negative ?-Solumedrol, breathing tx, azithromycin ?-Continue to wean oxygen as tolerated, maintain saturation >88% ? ?Hypothyroidism ?-Synthroid ? ?HTN ?-Bisoprolol, HCTZ  ? ?Depression ?-Zoloft ? ?HLD ?-Fenofibrate and zocor ? ?Tobacco abuse ?-Cessation counseling  ?-Nicotine patch  ? ?Demand ischemia ?-Flat troponin trend 20, 20 ?-Echo without regional wall motion abnormalities ? ?Generalized weakness  and deconditioning ?-PT ? ? ?DVT prophylaxis:  ?heparin injection 5,000 Units Start: 01/17/22 0600 ?SCDs Start: 01/17/22 0021 ? ?Code Status: Full ?Family Communication: None at bedside ?Disposition Plan:  ?Status is: Inpatient ?Remains inpatient appropriate because: IV solumedrol, respiratory failure, needs PT ? ?Antimicrobials:  ?Anti-infectives (From admission, onward)  ? ? Start     Dose/Rate Route Frequency Ordered Stop  ? 01/18/22 1345  azithromycin (ZITHROMAX) tablet 500 mg       ? 500 mg Oral Daily 01/18/22 1254    ? ?  ? ? ? ?Objective: ?Vitals:  ? 01/18/22 2156 01/19/22 0340 01/19/22 0809 01/19/22 0819  ?BP: (!) 174/63 (!) 160/71    ?Pulse: 78 75    ?Resp: 20     ?Temp: 98.4 ?F (36.9 ?C) 97.8 ?F (36.6 ?C)    ?TempSrc: Oral Oral    ?SpO2: 95% 95% 97% 100%  ?Weight:      ?Height:      ? ? ?Intake/Output Summary (Last 24 hours) at 01/19/2022 1021 ?Last data filed at 01/19/2022 0900 ?Gross per 24 hour  ?Intake 480 ml  ?Output --  ?Net 480 ml  ? ? ?Filed Weights  ? 01/16/22 1733  ?Weight: 59 kg  ? ? ?Examination:  ?General exam: Appears calm and comfortable, chronically ill-appearing ?Respiratory system: Clear to auscultation bilaterally without wheezing, mild conversational dyspnea, on 2 L oxygen ?Cardiovascular system: S1 & S2 heard, RRR. No murmurs. No pedal edema. ?Gastrointestinal system: Abdomen is nondistended, soft and nontender. Normal bowel sounds heard. ?Central nervous system: Alert and oriented. No focal neurological deficits. Speech clear.  ?Extremities: Symmetric in appearance  ?  Skin: No rashes, lesions or ulcers on exposed skin  ?Psychiatry: Judgement and insight appear normal. Mood & affect appropriate.  ? ?Data Reviewed: I have personally reviewed following labs and imaging studies ? ?CBC: ?Recent Labs  ?Lab 01/16/22 ?1750 01/17/22 ?0326  ?WBC 4.9 3.6*  ?NEUTROABS  --  3.2  ?HGB 13.3 12.5  ?HCT 41.3 39.2  ?MCV 88.4 87.7  ?PLT 146* 122*  ? ? ?Basic Metabolic Panel: ?Recent Labs  ?Lab  01/16/22 ?1750 01/17/22 ?0326  ?NA 140 140  ?K 3.8 3.6  ?CL 108 108  ?CO2 24 23  ?GLUCOSE 110* 139*  ?BUN 18 20  ?CREATININE 0.88 0.95  ?CALCIUM 8.8* 8.8*  ?MG  --  1.8  ? ? ?GFR: ?Estimated Creatinine Clearance: 40.4 mL/min (by C-G formula based on SCr of 0.95 mg/dL). ?Liver Function Tests: ?Recent Labs  ?Lab 01/17/22 ?0326  ?AST 26  ?ALT 17  ?ALKPHOS 33*  ?BILITOT 0.3  ?PROT 6.6  ?ALBUMIN 3.7  ? ? ?No results for input(s): LIPASE, AMYLASE in the last 168 hours. ?No results for input(s): AMMONIA in the last 168 hours. ?Coagulation Profile: ?No results for input(s): INR, PROTIME in the last 168 hours. ?Cardiac Enzymes: ?No results for input(s): CKTOTAL, CKMB, CKMBINDEX, TROPONINI in the last 168 hours. ?BNP (last 3 results) ?No results for input(s): PROBNP in the last 8760 hours. ?HbA1C: ?No results for input(s): HGBA1C in the last 72 hours. ?CBG: ?No results for input(s): GLUCAP in the last 168 hours. ?Lipid Profile: ?No results for input(s): CHOL, HDL, LDLCALC, TRIG, CHOLHDL, LDLDIRECT in the last 72 hours. ?Thyroid Function Tests: ?No results for input(s): TSH, T4TOTAL, FREET4, T3FREE, THYROIDAB in the last 72 hours. ?Anemia Panel: ?No results for input(s): VITAMINB12, FOLATE, FERRITIN, TIBC, IRON, RETICCTPCT in the last 72 hours. ?Sepsis Labs: ?No results for input(s): PROCALCITON, LATICACIDVEN in the last 168 hours. ? ?No results found for this or any previous visit (from the past 240 hour(s)).  ? ? ?Radiology Studies: ?DG Ribs Unilateral Left ? ?Result Date: 01/18/2022 ?CLINICAL DATA:  Left sided chest pain radiating like a band around to back. EXAM: LEFT RIBS - 2 VIEW COMPARISON:  Chest x-ray January 16, 2022. FINDINGS: No evidence a displaced left-sided rib fracture. Please see recent chest x-ray for characterization of the lungs. IMPRESSION: No evidence a displaced left-sided rib fracture. Electronically Signed   By: Margaretha Sheffield M.D.   On: 01/18/2022 11:39  ? ?ECHOCARDIOGRAM COMPLETE ? ?Result Date:  01/17/2022 ?   ECHOCARDIOGRAM REPORT   Patient Name:   RALYNN MCGINNITY Date of Exam: 01/17/2022 Medical Rec #:  [id number]     Height:       63.0 in Accession #:    [id number]    Weight:       130.0 lb Date of Birth:  1940-10-11      BSA:          1.610 m? Patient Age:    77 years      BP:           189/77 mmHg Patient Gender: F             HR:           73 bpm. Exam Location:  [facility] Procedure: 2D Echo, Cardiac Doppler and Color Doppler Indications:    Elevated Troponin  History:        Patient has no prior history of Echocardiogram examinations.  COPD; Risk Factors:Hypertension, Dyslipidemia and Current                 Smoker. Images by Lonn Georgia, student.  Sonographer:    Wenda Low Referring Phys: [place] Comments: Image acquisition challenging due to respiratory motion. IMPRESSIONS  1. Left ventricular ejection fraction, by estimation, is 70 to 75%. The left ventricle has normal function. The left ventricle has no regional wall motion abnormalities. Left ventricular diastolic parameters are consistent with Grade I diastolic dysfunction (impaired relaxation).  2. Right ventricular systolic function is normal. The right ventricular size is normal. There is moderately elevated pulmonary artery systolic pressure.  3. The mitral valve is normal in structure. No evidence of mitral valve regurgitation. No evidence of mitral stenosis.  4. The tricuspid valve is abnormal.  5. The aortic valve was not well visualized. Aortic valve regurgitation is not visualized. No aortic stenosis is present.  6. The inferior vena cava is normal in size with greater than 50% respiratory variability, suggesting right atrial pressure of 3 mmHg. FINDINGS  Left Ventricle: Left ventricular ejection fraction, by estimation, is 70 to 75%. The left ventricle has normal function. The left ventricle has no regional wall motion abnormalities. The left ventricular internal cavity size was normal in size. There  is  no left ventricular hypertrophy. Left ventricular diastolic parameters are consistent with Grade I diastolic dysfunction (impaired relaxation). Normal left ventricular filling pressure. Right Ventricle: The right

## 2022-01-19 NOTE — Progress Notes (Signed)
PT Cancellation Note ? ?Patient Details ?Name: Madeline Branch ?MRN: [id number] ?DOB: Jun 20, 1941 ? ? ?Cancelled Treatment:    Reason Eval/Treat Not Completed: Pain limiting ability to participate. On first attempt, patient was eating her breakfast. On second attempt, patient was complaining of left sided pain and nurse entered room to administer pill form pain medicine. Will attempt to see patient later as time permits. ? ? ?Floria Raveling. Hartnett-Rands, MS, PT ?Per Branch ?[place] [id number] ? ?01/19/2022, 12:39 PM ?

## 2022-01-19 NOTE — TOC Initial Note (Signed)
Transition of Care (TOC) - Initial/Assessment Note  ? ? ?Patient Details  ?Name: Madeline Branch ?MRN: [id number] ?Date of Birth: November 01, 1940 ? ?Transition of Care (TOC) CM/SW Contact:    ?Villa Herb, LCSWA ?Phone Number: ?01/19/2022, 11:56 AM ? ?Clinical Narrative:                 ?CSW spoke with pt in room to complete assessment. Pt lives with her son, he works during the day. Prior to hospitalization pt was independent in the home. CSW spoke with pt about if PT recommends SNF at D/C. Pt states that she will go to SNF if recommended. Pt is hopeful she can go home with [facility] services. TOC to await PT recommendation. TOC to follow.  ? ?Expected Discharge Plan: Skilled Nursing Facility ?Barriers to Discharge: Continued Medical Work up ? ? ?Patient Goals and CMS Choice ?Patient states their goals for this hospitalization and ongoing recovery are:: Get better ?CMS Medicare.gov Compare Post Acute Care list provided to:: Patient ?Choice offered to / list presented to : Patient ? ?Expected Discharge Plan and Services ?Expected Discharge Plan: Skilled Nursing Facility ?In-house Referral: Clinical Social Work ?  ?  ?Living arrangements for the past 2 months: Single Family Home ?                ?  ?  ?  ?  ?  ?  ?  ?  ?  ?  ? ?Prior Living Arrangements/Services ?Living arrangements for the past 2 months: Single Family Home ?Lives with:: Adult Children ?Patient language and need for interpreter reviewed:: Yes ?Do you feel safe going back to the place where you live?: Yes      ?Need for Family Participation in Patient Care: Yes (Comment) ?Care giver support system in place?: Yes (comment) ?  ?Criminal Activity/Legal Involvement Pertinent to Current Situation/Hospitalization: No - Comment as needed ? ?Activities of Daily Living ?Home Assistive Devices/Equipment: None ?ADL Screening (condition at time of admission) ?Patient's cognitive ability adequate to safely complete daily activities?: Yes ?Is the patient deaf or have difficulty  hearing?: No ?Does the patient have difficulty seeing, even when wearing glasses/contacts?: No ?Does the patient have difficulty concentrating, remembering, or making decisions?: No ?Patient able to express need for assistance with ADLs?: Yes ?Does the patient have difficulty dressing or bathing?: Yes ?Independently performs ADLs?: No ?Communication: Independent ?Dressing (OT): Needs assistance ?Is this a change from baseline?: Change from baseline, expected to last <3days ?Grooming: Needs assistance ?Is this a change from baseline?: Change from baseline, expected to last <3 days ?Feeding: Independent ?Bathing: Needs assistance ?Is this a change from baseline?: Change from baseline, expected to last <3 days ?Toileting: Independent with device (comment) (purewick) ?In/Out Bed: Needs assistance ?Is this a change from baseline?: Change from baseline, expected to last <3 days ?Walks in Home: Independent ?Does the patient have difficulty walking or climbing stairs?: Yes ?Weakness of Legs: Both ?Weakness of Arms/Hands: Both ? ?Permission Sought/Granted ?  ?  ?   ?   ?   ?   ? ?Emotional Assessment ?Appearance:: Appears stated age ?Attitude/Demeanor/Rapport: Engaged ?Affect (typically observed): Accepting ?Orientation: : Oriented to Self, Oriented to Place, Oriented to  Time, Oriented to Situation ?Alcohol / Substance Use: Not Applicable ?Psych Involvement: No (comment) ? ?Admission diagnosis:  Hypoxia [R09.02] ?COPD exacerbation (HCC) [J44.1] ?Patient Active Problem List  ? Diagnosis Date Noted  ? Demand ischemia (HCC) 01/18/2022  ? Acute respiratory failure with hypoxia (HCC) 01/17/2022  ? Tobacco  use disorder 01/17/2022  ? Essential hypertension 01/17/2022  ? Depression 01/17/2022  ? Thyroid disease 01/17/2022  ? Hyperlipidemia 01/17/2022  ? COPD exacerbation (HCC) 01/16/2022  ? Loss of weight 09/09/2014  ? GERD (gastroesophageal reflux disease) 06/03/2014  ? Dyspepsia 04/01/2014  ? Dysphagia, unspecified(787.20)  04/01/2014  ? Loose stools 04/01/2014  ? ?PCP:  Pcp, No ?Pharmacy:   ?[id number] - [place], [place] - [address] ?[address] ?[place] [address] ?Phone: [phone] Fax: [phone] ? ? ? ? ?Social Determinants of Health (SDOH) Interventions ?  ? ?Readmission Risk Interventions ?   ? View : No data to display.  ?  ?  ?  ? ? ? ?

## 2022-01-20 ENCOUNTER — Inpatient Hospital Stay (HOSPITAL_COMMUNITY): Payer: Medicare Other

## 2022-01-20 DIAGNOSIS — J441 Chronic obstructive pulmonary disease with (acute) exacerbation: Secondary | ICD-10-CM | POA: Diagnosis not present

## 2022-01-20 MED ORDER — TIZANIDINE HCL 2 MG PO TABS
2.0000 mg | ORAL_TABLET | Freq: Three times a day (TID) | ORAL | Status: DC | PRN
  Administered 2022-01-20: 2 mg via ORAL
  Filled 2022-01-20: qty 1

## 2022-01-20 MED ORDER — METHOCARBAMOL 1000 MG/10ML IJ SOLN
500.0000 mg | Freq: Four times a day (QID) | INTRAVENOUS | Status: DC | PRN
  Administered 2022-01-20 – 2022-01-24 (×2): 500 mg via INTRAVENOUS
  Filled 2022-01-20 (×2): qty 5

## 2022-01-20 MED ORDER — PREDNISONE 20 MG PO TABS
50.0000 mg | ORAL_TABLET | Freq: Every day | ORAL | Status: DC
Start: 2022-01-21 — End: 2022-01-21
  Administered 2022-01-21: 50 mg via ORAL
  Filled 2022-01-20: qty 1

## 2022-01-20 NOTE — Progress Notes (Addendum)
Pt is c/o severe pain located under her left lower ribs radiating around to her back. It is also radiating to her groin, down her left leg. She describes the pain as sharp and shooting/ This pain is new. Pt repositioned. MD notified. ?

## 2022-01-20 NOTE — Evaluation (Signed)
Physical Therapy Evaluation ?Patient Details ?Name: Madeline Branch ?MRN: [id number] ?DOB: 1941/03/10 ?Today's Date: 01/20/2022 ? ?History of Present Illness ? Madeline Branch is a 81 y.o. female with medical history significant of atrial fibrillation, hyperlipidemia, hypertension, hypothyroidism, PTSD, presents to the ED with a chief complaint of shortness of breath.  It was gradual in onset and started 3 days ago.  Became progressively worse over the last 2 days.  She has associated cough with clear and green sputum.  She has no fever, no chest pain.  She does admit to palpitations.  Her dyspnea and palpitations are nonexertional per her report.  She reports associated stomach muscle wall soreness from coughing.  She describes a pressure sensation in her chest.  Laying down makes that sensation worse.  Sitting up makes it better.  Patient is a current smoker 1 pack/day.  Patient has no known history of COPD.  She has no other complaints at this time. ? ?  ?Clinical Impression ? Patient limited for functional mobility as stated below secondary to L flank/leg pain, BLE weakness, fatigue and poor standing balance. Requires assist to transition to seated EOB cue to pain and weakness. Demonstrates fair sitting balance initially which improves after several minutes. Able to transfer to standing 3x with RW and assist with impaired weightbearing on LLE due to c/o pain/weakness. She is limited to several lateral steps at bedside with LLE buckling throughout. Patient will benefit from continued physical therapy in hospital and recommended venue below to increase strength, balance, endurance for safe ADLs and gait. ?   ?   ? ?Recommendations for follow up therapy are one component of a multi-disciplinary discharge planning process, led by the attending physician.  Recommendations may be updated based on patient status, additional functional criteria and insurance authorization. ? ?Follow Up Recommendations Skilled nursing-short term  rehab (<3 hours/day) ? ?  ?Assistance Recommended at Discharge Intermittent Supervision/Assistance  ?Patient can return home with the following ? A lot of help with walking and/or transfers;A lot of help with bathing/dressing/bathroom;Assistance with cooking/housework;Assist for transportation;Help with stairs or ramp for entrance ? ?  ?Equipment Recommendations None recommended by PT  ?Recommendations for Other Services ?    ?  ?Functional Status Assessment Patient has had a recent decline in their functional status and demonstrates the ability to make significant improvements in function in a reasonable and predictable amount of time.  ? ?  ?Precautions / Restrictions Precautions ?Precautions: Fall ?Restrictions ?Weight Bearing Restrictions: No  ? ?  ? ?Mobility ? Bed Mobility ?Overal bed mobility: Needs Assistance ?Bed Mobility: Supine to Sit, Sit to Supine ?  ?  ?Supine to sit: Min assist, Mod assist, HOB elevated ?Sit to supine: Min assist, Mod assist ?  ?General bed mobility comments: assist for LE movement and to pull to seated and upright trunk due to weakness and pain ?  ? ?Transfers ?Overall transfer level: Needs assistance ?Equipment used: Rolling walker (2 wheels) ?Transfers: Sit to/from Stand ?Sit to Stand: Min assist, Mod assist ?  ?  ?  ?  ?  ?General transfer comment: 3x with RW, assist for weakness, cueing for sequencing ?  ? ?Ambulation/Gait ?Ambulation/Gait assistance: Mod assist ?Gait Distance (Feet): 2 Feet ?Assistive device: Rolling walker (2 wheels) ?Gait Pattern/deviations: Knees buckling ?Gait velocity: decreased ?  ?  ?General Gait Details: lateral steps at bedside with L leg buckling due to c/o pain ? ?Stairs ?  ?  ?  ?  ?  ? ?Wheelchair Mobility ?  ? ?  Modified Rankin (Stroke Patients Only) ?  ? ?  ? ?Balance Overall balance assessment: Needs assistance ?Sitting-balance support: No upper extremity supported, Feet supported ?Sitting balance-Leahy Scale: Fair ?Sitting balance - Comments:  seated EOB ?  ?  ?Standing balance-Leahy Scale: Poor ?Standing balance comment: with RW ?  ?  ?  ?  ?  ?  ?  ?  ?  ?  ?  ?   ? ? ? ?Pertinent Vitals/Pain Pain Assessment ?Pain Assessment: Faces ?Faces Pain Scale: Hurts even more ?Pain Location: L leg ?Pain Descriptors / Indicators: Aching, Grimacing, Guarding, Spasm ?Pain Intervention(s): Limited activity within patient's tolerance, Monitored during session, Premedicated before session, Repositioned  ? ? ?Home Living Family/patient expects to be discharged to:: Private residence ?Living Arrangements: Children ?Available Help at Discharge: Family;Available PRN/intermittently (son works) ?Type of Home: House ?Home Access: Stairs to enter ?Entrance Stairs-Rails: None ?Entrance Stairs-Number of Steps: 3-4 ?  ?Home Layout: One level ?Home Equipment: None ?   ?  ?Prior Function Prior Level of Function : Independent/Modified Independent ?  ?  ?  ?  ?  ?  ?Mobility Comments: household ambulation without AD ?ADLs Comments: independent wth basic ADL ?  ? ? ?Hand Dominance  ?   ? ?  ?Extremity/Trunk Assessment  ? Upper Extremity Assessment ?Upper Extremity Assessment: Overall WFL for tasks assessed;Generalized weakness ?  ? ?Lower Extremity Assessment ?Lower Extremity Assessment: Generalized weakness;LLE deficits/detail ?LLE: Unable to fully assess due to pain ?  ? ?   ?Communication  ? Communication: No difficulties  ?Cognition Arousal/Alertness: Awake/alert ?Behavior During Therapy: [facility] for tasks assessed/performed ?Overall Cognitive Status: Within Functional Limits for tasks assessed ?  ?  ?  ?  ?  ?  ?  ?  ?  ?  ?  ?  ?  ?  ?  ?  ?  ?  ?  ? ?  ?General Comments   ? ?  ?Exercises    ? ?Assessment/Plan  ?  ?PT Assessment Patient needs continued PT services  ?PT Problem List Decreased strength;Decreased mobility;Decreased range of motion;Decreased activity tolerance;Decreased balance;Decreased knowledge of use of DME;Pain ? ?   ?  ?PT Treatment Interventions DME  instruction;Therapeutic exercise;Gait training;Balance training;Stair training;Neuromuscular re-education;Functional mobility training;Therapeutic activities;Patient/family education   ? ?PT Goals (Current goals can be found in the Care Plan section)  ?Acute Rehab PT Goals ?Patient Stated Goal: return home ?PT Goal Formulation: With patient ?Time For Goal Achievement: 02/03/22 ?Potential to Achieve Goals: Fair ? ?  ?Frequency Min 3X/week ?  ? ? ?Co-evaluation   ?  ?  ?  ?  ? ? ?  ?AM-PAC PT "6 Clicks" Mobility  ?Outcome Measure Help needed turning from your back to your side while in a flat bed without using bedrails?: A Little ?Help needed moving from lying on your back to sitting on the side of a flat bed without using bedrails?: A Lot ?Help needed moving to and from a bed to a chair (including a wheelchair)?: A Lot ?Help needed standing up from a chair using your arms (e.g., wheelchair or bedside chair)?: A Lot ?Help needed to walk in hospital room?: A Lot ?Help needed climbing 3-5 steps with a railing? : A Lot ?6 Click Score: 13 ? ?  ?End of Session Equipment Utilized During Treatment: Gait belt;Oxygen ?Activity Tolerance: Patient limited by pain;Patient limited by fatigue ?Patient left: in bed;with call bell/phone within reach;with bed alarm set ?Nurse Communication: Mobility status ?PT Visit Diagnosis: Unsteadiness on  feet (R26.81);Other abnormalities of gait and mobility (R26.89);Muscle weakness (generalized) (M62.81);Pain ?Pain - Right/Left: Left ?Pain - part of body: Hip;Leg;Knee ?  ? ?Time: [id number] ?PT Time Calculation (min) (ACUTE ONLY): 32 min ? ? ?Charges:   PT Evaluation ?$PT Eval Moderate Complexity: 1 Mod ?PT Treatments ?$Therapeutic Activity: 23-37 mins ?  ?   ? ? ?2:59 PM, 01/20/22 ?Mearl Latin PT, DPT ?Physical Therapist at [facility] ?[facility] ? ?

## 2022-01-20 NOTE — TOC Progression Note (Signed)
Transition of Care (TOC) - Progression Note  ? ? ?Patient Details  ?Name: OMOLOLA Branch ?MRN: [id number] ?Date of Birth: 30-Jan-1941 ? ?Transition of Care (TOC) CM/SW Contact  ?Leitha Bleak, RN ?Phone Number: ?01/20/2022, 3:23 PM ? ?Clinical Narrative:   PT is recommending SNF. Patient states it is the pain medication causing her to be weak.  Discharge planning for Monday. Patient wants to go home but SNF is anticipated. TOC sending out FL2, PASSR is pending, uploaded documents as requested. TOC to follow.  ? ? ?Expected Discharge Plan: Skilled Nursing Facility ?Barriers to Discharge: Continued Medical Work up ? ?Expected Discharge Plan and Services ?Expected Discharge Plan: Skilled Nursing Facility ?In-house Referral: Clinical Social Work ?   ?Living arrangements for the past 2 months: Single Family Home ?     ?Readmission Risk Interventions ?   ? View : No data to display.  ?  ?  ?  ? ? ?

## 2022-01-20 NOTE — [place] FL2 (Signed)
?[place] MEDICAID FL2 LEVEL OF CARE SCREENING TOOL  ?  ? ?IDENTIFICATION  ?Patient Name: ?Madeline Branch Birthdate: 1941-03-14 Sex: female Admission Date (Current Location): ?01/16/2022  ?[place] Number: ? [place] ?  Facility and Address:  ?[facility],  [address] [place] [address] ?     Provider Number: ?[id number]  ?Attending Physician Name and Address:  ?Noralee Stain, DO ? Relative Name and Phone Number:  ?  ?   ?Current Level of Care: ?Hospital Recommended Level of Care: ?Skilled Nursing Facility Prior Approval Number: ?  ? ?Date Approved/Denied: ?  PASRR Number: ?  ? ?Discharge Plan: ?SNF ?  ? ?Current Diagnoses: ?Patient Active Problem List  ? Diagnosis Date Noted  ? Demand ischemia (HCC) 01/18/2022  ? Acute respiratory failure with hypoxia (HCC) 01/17/2022  ? Tobacco use disorder 01/17/2022  ? Essential hypertension 01/17/2022  ? Depression 01/17/2022  ? Thyroid disease 01/17/2022  ? Hyperlipidemia 01/17/2022  ? COPD exacerbation (HCC) 01/16/2022  ? Loss of weight 09/09/2014  ? GERD (gastroesophageal reflux disease) 06/03/2014  ? Dyspepsia 04/01/2014  ? Dysphagia, unspecified(787.20) 04/01/2014  ? Loose stools 04/01/2014  ? ? ?Orientation RESPIRATION BLADDER Height & Weight   ?  ?Self, Time, Situation, Place ? O2 (2L) Continent Weight: 59 kg ?Height:  5\' 3"  (160 cm)  ?BEHAVIORAL SYMPTOMS/MOOD NEUROLOGICAL BOWEL NUTRITION STATUS  ?    Continent Diet (Heart healthy)  ?AMBULATORY STATUS COMMUNICATION OF NEEDS Skin   ?Extensive Assist Verbally Normal ?  ?  ?  ?    ?     ?     ? ? ?Personal Care Assistance Level of Assistance  ?Bathing, Feeding, Dressing Bathing Assistance: Limited assistance ?Feeding assistance: Independent ?Dressing Assistance: Limited assistance ?   ? ?Functional Limitations Info  ?Sight, Speech, Hearing Sight Info: Adequate ?Hearing Info: Adequate ?Speech Info: Adequate  ? ? ?SPECIAL CARE FACTORS FREQUENCY  ?PT (By licensed PT), OT (By licensed OT)   ?  ?PT  Frequency: 5 times weekly ?OT Frequency: 5 times weekly ?  ?  ?  ?   ? ? ?Contractures Contractures Info: Not present  ? ? ?Additional Factors Info  ?Code Status, Allergies, Psychotropic Code Status Info: FULL ?Allergies Info: Asa (aspirin) and penicillins ?Psychotropic Info: Zoloft ?  ?  ?   ? ?Current Medications (01/20/2022):  This is the current hospital active medication list ?Current Facility-Administered Medications  ?Medication Dose Route Frequency Provider Last Rate Last Admin  ? acetaminophen (TYLENOL) tablet 650 mg  650 mg Oral Q6H PRN Zierle-Ghosh, Asia B, DO      ? Or  ? acetaminophen (TYLENOL) suppository 650 mg  650 mg Rectal Q6H PRN Zierle-Ghosh, Asia B, DO      ? albuterol (VENTOLIN HFA) 108 (90 Base) MCG/ACT inhaler 2 puff  2 puff Inhalation Q2H PRN Zierle-Ghosh, Asia B, DO      ? azithromycin (ZITHROMAX) tablet 500 mg  500 mg Oral Daily 01/22/2022, DO   500 mg at 01/20/22 0950  ? bisoprolol (ZEBETA) tablet 10 mg  10 mg Oral Daily 01/22/22, MD   10 mg at 01/20/22 01/22/22  ? fenofibrate tablet 54 mg  54 mg Oral Daily Zierle-Ghosh, Asia B, DO   54 mg at 01/20/22 0951  ? heparin injection 5,000 Units  5,000 Units Subcutaneous Q8H Zierle-Ghosh, Asia B, DO   5,000 Units at 01/20/22 0531  ? hydrALAZINE (APRESOLINE) tablet 25 mg  25 mg Oral Q6H PRN 01/22/22,  MD   25 mg at 01/17/22 1752  ? hydrochlorothiazide (HYDRODIURIL) tablet 6.25 mg  6.25 mg Oral Daily Tyrone Nine, MD   6.25 mg at 01/20/22 0630  ? ipratropium-albuterol (DUONEB) 0.5-2.5 (3) MG/3ML nebulizer solution 3 mL  3 mL Nebulization TID Noralee Stain, DO   3 mL at 01/20/22 1445  ? levothyroxine (SYNTHROID) tablet 300 mcg  300 mcg Oral Daily Zierle-Ghosh, Asia B, DO   300 mcg at 01/20/22 0531  ? methocarbamol (ROBAXIN) 500 mg in dextrose 5 % 50 mL IVPB  500 mg Intravenous Q6H PRN Noralee Stain, DO 100 mL/hr at 01/20/22 1133 500 mg at 01/20/22 1133  ? mometasone-formoterol (DULERA) 200-5 MCG/ACT inhaler 2 puff  2 puff Inhalation BID  Zierle-Ghosh, Asia B, DO   2 puff at 01/20/22 0842  ? morphine (PF) 2 MG/ML injection 2 mg  2 mg Intravenous Q2H PRN Zierle-Ghosh, Asia B, DO   2 mg at 01/20/22 1303  ? nicotine (NICODERM CQ - dosed in mg/24 hours) patch 21 mg  21 mg Transdermal Daily Zierle-Ghosh, Asia B, DO   21 mg at 01/20/22 0948  ? ondansetron (ZOFRAN) tablet 4 mg  4 mg Oral Q6H PRN Zierle-Ghosh, Asia B, DO      ? Or  ? ondansetron (ZOFRAN) injection 4 mg  4 mg Intravenous Q6H PRN Zierle-Ghosh, Asia B, DO      ? oxyCODONE (Oxy IR/ROXICODONE) immediate release tablet 5 mg  5 mg Oral Q4H PRN Zierle-Ghosh, Asia B, DO   5 mg at 01/20/22 0211  ? pantoprazole (PROTONIX) EC tablet 40 mg  40 mg Oral Daily Zierle-Ghosh, Asia B, DO   40 mg at 01/20/22 0953  ? [START ON 01/21/2022] predniSONE (DELTASONE) tablet 50 mg  50 mg Oral Q breakfast Noralee Stain, DO      ? sertraline (ZOLOFT) tablet 25 mg  25 mg Oral Daily Zierle-Ghosh, Asia B, DO   25 mg at 01/20/22 0951  ? simvastatin (ZOCOR) tablet 10 mg  10 mg Oral Daily Zierle-Ghosh, Asia B, DO   10 mg at 01/20/22 1601  ? ? ? ?Discharge Medications: ?Please see discharge summary for a list of discharge medications. ? ?Relevant Imaging Results: ? ?Relevant Lab Results: ? ? ?Additional Information ?SSN: [id number] ? ?Leitha Bleak, RN ? ? ? ? ?

## 2022-01-20 NOTE — Progress Notes (Signed)
?PROGRESS NOTE ? ? ? Madeline Branch  [id number] DOB: 09/27/1941 DOA: 01/16/2022 ?PCP: Pcp, No  ? ?  ?Brief Narrative:  ?Madeline Branch is a 81 y.o. female with medical history significant of atrial fibrillation, hyperlipidemia, hypertension, hypothyroidism, PTSD, presents to the ED with a chief complaint of shortness of breath.  It was gradual in onset and started 3 days ago.  Became progressively worse over the last 2 days.  She has associated cough with clear and green sputum.  She has no fever, no chest pain.  She does admit to palpitations.  Her dyspnea and palpitations are nonexertional per her report.  Patient is a current smoker 1 pack/day, has been smoking since she was 81 years old. She was admitted for COPD exacerbation.  ? ?New events last 24 hours / Subjective: ?Had acute onset of left lateral hip pain.  She states that her pain radiates on the lateral side of her left thigh down to her knee.  Denies any numbness or loss of sensation. ? ?Assessment & Plan: ?  ? ?Principal Problem: ?  COPD exacerbation ([place]) ?Active Problems: ?  GERD (gastroesophageal reflux disease) ?  Acute respiratory failure with hypoxia ([place]) ?  Tobacco use disorder ?  Essential hypertension ?  Depression ?  Thyroid disease ?  Hyperlipidemia ?  Demand ischemia ([place]) ? ? ?Acute hypoxemic respiratory failure secondary to COPD exacerbation ?-CXR: Hyperinflated lungs suggesting underlying COPD with coarsened interstitial markings throughout both lungs favored chronic ?-Left rib xray negative for fracture ?-D-dimer negative ?-Solumedrol, breathing tx, azithromycin --improving, wean Solu-Medrol to prednisone ?-Continue to wean oxygen as tolerated, maintain saturation >88% ? ?Left hip pain ?-X-ray negative for acute fracture ?-Trial Robaxin for spasms ? ?Hypothyroidism ?-Synthroid ? ?HTN ?-Bisoprolol, HCTZ  ? ?Depression ?-Zoloft ? ?HLD ?-Fenofibrate and zocor ? ?Tobacco abuse ?-Cessation counseling  ?-Nicotine patch  ? ?Demand  ischemia ?-Flat troponin trend 20, 20 ?-Echo without regional wall motion abnormalities ? ?Generalized weakness and deconditioning ?-PT Pending ? ? ?DVT prophylaxis:  ?heparin injection 5,000 Units Start: 01/17/22 0600 ?SCDs Start: 01/17/22 0021 ? ?Code Status: Full ?Family Communication: None at bedside ?Disposition Plan:  ?Status is: Inpatient ?Remains inpatient appropriate because: Patient requires PT evaluation ? ?Antimicrobials:  ?Anti-infectives (From admission, onward)  ? ? Start     Dose/Rate Route Frequency Ordered Stop  ? 01/18/22 1345  azithromycin (ZITHROMAX) tablet 500 mg       ? 500 mg Oral Daily 01/18/22 1254    ? ?  ? ? ? ?Objective: ?Vitals:  ? 01/19/22 2125 01/20/22 0415 01/20/22 0840 01/20/22 0843  ?BP: (!) 157/79 (!) 160/84    ?Pulse: 80 69    ?Resp: 20 20    ?Temp: 98.5 ?F (36.9 ?C) 97.6 ?F (36.4 ?C)    ?TempSrc:      ?SpO2: 93% 95% 94% 98%  ?Weight:      ?Height:      ? ? ?Intake/Output Summary (Last 24 hours) at 01/20/2022 1115 ?Last data filed at 01/20/2022 0900 ?Gross per 24 hour  ?Intake 800 ml  ?Output 1100 ml  ?Net -300 ml  ? ? ?Filed Weights  ? 01/16/22 1733  ?Weight: 59 kg  ? ? ?Examination:  ?General exam: Appears calm, chronically ill-appearing ?Respiratory system: Clear to auscultation bilaterally without wheezing, mild conversational dyspnea, on 2 L oxygen ?Cardiovascular system: S1 & S2 heard, RRR. No murmurs. No pedal edema. ?Gastrointestinal system: Abdomen is nondistended, soft and nontender. Normal bowel sounds heard. ?Central nervous system: Alert  and oriented. No focal neurological deficits. Speech clear.  ?Extremities: Symmetric in appearance, no erythema or swelling of LE extremities, point TTP left lateral hip and along IT band  ?Skin: No rashes, lesions or ulcers on exposed skin  ?Psychiatry: Judgement and insight appear normal. Mood & affect appropriate.  ? ?Data Reviewed: I have personally reviewed following labs and imaging studies ? ?CBC: ?Recent Labs  ?Lab  01/16/22 ?1750 01/17/22 ?0326  ?WBC 4.9 3.6*  ?NEUTROABS  --  3.2  ?HGB 13.3 12.5  ?HCT 41.3 39.2  ?MCV 88.4 87.7  ?PLT 146* 122*  ? ? ?Basic Metabolic Panel: ?Recent Labs  ?Lab 01/16/22 ?1750 01/17/22 ?0326  ?NA 140 140  ?K 3.8 3.6  ?CL 108 108  ?CO2 24 23  ?GLUCOSE 110* 139*  ?BUN 18 20  ?CREATININE 0.88 0.95  ?CALCIUM 8.8* 8.8*  ?MG  --  1.8  ? ? ?GFR: ?Estimated Creatinine Clearance: 40.4 mL/min (by C-G formula based on SCr of 0.95 mg/dL). ?Liver Function Tests: ?Recent Labs  ?Lab 01/17/22 ?0326  ?AST 26  ?ALT 17  ?ALKPHOS 33*  ?BILITOT 0.3  ?PROT 6.6  ?ALBUMIN 3.7  ? ? ?No results for input(s): LIPASE, AMYLASE in the last 168 hours. ?No results for input(s): AMMONIA in the last 168 hours. ?Coagulation Profile: ?No results for input(s): INR, PROTIME in the last 168 hours. ?Cardiac Enzymes: ?No results for input(s): CKTOTAL, CKMB, CKMBINDEX, TROPONINI in the last 168 hours. ?BNP (last 3 results) ?No results for input(s): PROBNP in the last 8760 hours. ?HbA1C: ?No results for input(s): HGBA1C in the last 72 hours. ?CBG: ?No results for input(s): GLUCAP in the last 168 hours. ?Lipid Profile: ?No results for input(s): CHOL, HDL, LDLCALC, TRIG, CHOLHDL, LDLDIRECT in the last 72 hours. ?Thyroid Function Tests: ?No results for input(s): TSH, T4TOTAL, FREET4, T3FREE, THYROIDAB in the last 72 hours. ?Anemia Panel: ?No results for input(s): VITAMINB12, FOLATE, FERRITIN, TIBC, IRON, RETICCTPCT in the last 72 hours. ?Sepsis Labs: ?No results for input(s): PROCALCITON, LATICACIDVEN in the last 168 hours. ? ?No results found for this or any previous visit (from the past 240 hour(s)).  ? ? ?Radiology Studies: ?DG Ribs Unilateral Left ? ?Result Date: 01/18/2022 ?CLINICAL DATA:  Left sided chest pain radiating like a band around to back. EXAM: LEFT RIBS - 2 VIEW COMPARISON:  Chest x-ray January 16, 2022. FINDINGS: No evidence a displaced left-sided rib fracture. Please see recent chest x-ray for characterization of the lungs.  IMPRESSION: No evidence a displaced left-sided rib fracture. Electronically Signed   By: Margaretha Sheffield M.D.   On: 01/18/2022 11:39  ? ?DG HIP UNILAT WITH PELVIS 2-3 VIEWS LEFT ? ?Result Date: 01/20/2022 ?CLINICAL DATA:  Left groin pain EXAM: DG HIP (WITH OR WITHOUT PELVIS) 2V LEFT COMPARISON:  None. FINDINGS: There is no evidence of hip fracture or dislocation. Mild degenerative changes of the bilateral hips. Moderate degenerative changes of the partially visualized lumbar spine. IMPRESSION: No acute osseous abnormality. Electronically Signed   By: Yetta Glassman M.D.   On: 01/20/2022 10:31   ? ? ? ?Scheduled Meds: ? azithromycin  500 mg Oral Daily  ? bisoprolol  10 mg Oral Daily  ? fenofibrate  54 mg Oral Daily  ? heparin  5,000 Units Subcutaneous Q8H  ? hydrochlorothiazide  6.25 mg Oral Daily  ? ipratropium-albuterol  3 mL Nebulization TID  ? levothyroxine  300 mcg Oral Daily  ? methylPREDNISolone (SOLU-MEDROL) injection  60 mg Intravenous Daily  ? mometasone-formoterol  2 puff  Inhalation BID  ? nicotine  21 mg Transdermal Daily  ? pantoprazole  40 mg Oral Daily  ? sertraline  25 mg Oral Daily  ? simvastatin  10 mg Oral Daily  ? ?Continuous Infusions: ? methocarbamol (ROBAXIN) IV    ? ? ? LOS: 4 days  ? ? ? ?Dessa Phi, DO ?[facility] ?01/20/2022, 11:15 AM  ? ?Available via Epic secure chat 7am-7pm ?After these hours, please refer to coverage provider listed on [email or web address] ? ?

## 2022-01-20 NOTE — Plan of Care (Signed)
?  Problem: Acute Rehab PT Goals(only PT should resolve) ?Goal: Pt Will Go Supine/Side To Sit ?Outcome: Progressing ?Flowsheets (Taken 01/20/2022 1500) ?Pt will go Supine/Side to Sit: with minimal assist ?Goal: Pt Will Go Sit To Supine/Side ?Outcome: Progressing ?Flowsheets (Taken 01/20/2022 1500) ?Pt will go Sit to Supine/Side: with minimal assist ?Goal: Patient Will Transfer Sit To/From Stand ?Outcome: Progressing ?Flowsheets (Taken 01/20/2022 1500) ?Patient will transfer sit to/from stand: ? with min guard assist ? with minimal assist ?Goal: Pt Will Transfer Bed To Chair/Chair To Bed ?Outcome: Progressing ?Flowsheets (Taken 01/20/2022 1500) ?Pt will Transfer Bed to Chair/Chair to Bed: ? with min assist ? min guard assist ?Goal: Pt Will Ambulate ?Outcome: Progressing ?Flowsheets (Taken 01/20/2022 1500) ?Pt will Ambulate: ? 25 feet ? with least restrictive assistive device ? with minimal assist ?Goal: Pt/caregiver will Perform Home Exercise Program ?Outcome: Progressing ?Flowsheets (Taken 01/20/2022 1500) ?Pt/caregiver will Perform Home Exercise Program: ? For increased strengthening ? For improved balance ? Independently ? 3:01 PM, 01/20/22 ?Wyman Songster PT, DPT ?Physical Therapist at [facility] ?[facility] ? ?

## 2022-01-20 NOTE — Progress Notes (Signed)
Patient cont to c/o left groin pain that radiates down left leg. Pt also states the left leg is numb. Pt receives PRNs, repositioned, and heat packs offered. Still no relief. MD notified via  page. ?

## 2022-01-21 DIAGNOSIS — J441 Chronic obstructive pulmonary disease with (acute) exacerbation: Secondary | ICD-10-CM | POA: Diagnosis not present

## 2022-01-21 MED ORDER — PREDNISONE 20 MG PO TABS
40.0000 mg | ORAL_TABLET | Freq: Every day | ORAL | Status: DC
  Administered 2022-01-22 – 2022-01-24 (×3): 40 mg via ORAL
  Filled 2022-01-21 (×3): qty 2

## 2022-01-21 NOTE — Progress Notes (Signed)
?PROGRESS NOTE ? ? ? Madeline Branch  [id number] DOB: [birth year] DOA: 01/16/2022 ?PCP: Pcp, No  ? ?  ?Brief Narrative:  ?Madeline Branch is a 81 y.o. female with medical history significant of atrial fibrillation, hyperlipidemia, hypertension, hypothyroidism, PTSD, presents to the ED with a chief complaint of shortness of breath.  It was gradual in onset and started 3 days ago.  Became progressively worse over the last 2 days.  She has associated cough with clear and green sputum.  She has no fever, no chest pain.  She does admit to palpitations.  Her dyspnea and palpitations are nonexertional per her report.  Patient is a current smoker 1 pack/day, has been smoking since she was 81 years old. She was admitted for COPD exacerbation.  ? ?New events last 24 hours / Subjective: ?Patient states that her left lateral hip pain has improved on Robaxin.  Has some numbness on anterior aspect of her left thigh.  Worked with physical therapy and recommended for SNF placement.  Breathing has significantly improved. ? ?Assessment & Plan: ?  ? ?Principal Problem: ?  COPD exacerbation ([place]) ?Active Problems: ?  GERD (gastroesophageal reflux disease) ?  Acute respiratory failure with hypoxia ([place]) ?  Tobacco use disorder ?  Essential hypertension ?  Depression ?  Thyroid disease ?  Hyperlipidemia ?  Demand ischemia ([place]) ? ? ?Acute hypoxemic respiratory failure secondary to COPD exacerbation ?-CXR: Hyperinflated lungs suggesting underlying COPD with coarsened interstitial markings throughout both lungs favored chronic ?-Left rib xray negative for fracture ?-D-dimer negative ?-Continue prednisone, breathing treatment, azithromycin x5 days ?-Continue to wean oxygen as tolerated, maintain saturation >88% ? ?Left hip pain ?-X-ray negative for acute fracture ?-Trial Robaxin for spasms ?-Improved ? ?Hypothyroidism ?-Synthroid ? ?HTN ?-Bisoprolol, HCTZ  ? ?Depression ?-Zoloft ? ?HLD ?-Fenofibrate and zocor ? ?Tobacco abuse ?-Cessation  counseling  ?-Nicotine patch  ? ?Demand ischemia ?-Flat troponin trend 20, 20 ?-Echo without regional wall motion abnormalities ? ?Generalized weakness and deconditioning ?-PT recommended SNF placement ? ? ?DVT prophylaxis:  ?heparin injection 5,000 Units Start: 01/17/22 0600 ?SCDs Start: 01/17/22 0021 ? ?Code Status: Full ?Family Communication: None at bedside ?Disposition Plan:  ?Status is: Inpatient ?Remains inpatient appropriate because: Pending SNF placement ? ?Antimicrobials:  ?Anti-infectives (From admission, onward)  ? ? Start     Dose/Rate Route Frequency Ordered Stop  ? 01/18/22 1345  azithromycin (ZITHROMAX) tablet 500 mg       ? 500 mg Oral Daily 01/18/22 1254    ? ?  ? ? ? ?Objective: ?Vitals:  ? 01/20/22 2119 01/21/22 [id number] 01/21/22 [id number] 01/21/22 0816  ?BP: (!) 134/58 (!) 142/57    ?Pulse: 80 72    ?Resp: 20 18    ?Temp: 98.4 ?F (36.9 ?C) 98.5 ?F (36.9 ?C)    ?TempSrc: Oral Oral    ?SpO2: 93% 93% 94% 94%  ?Weight:      ?Height:      ? ? ?Intake/Output Summary (Last 24 hours) at 01/21/2022 1236 ?Last data filed at 01/21/2022 0800 ?Gross per 24 hour  ?Intake 168.27 ml  ?Output 300 ml  ?Net -131.73 ml  ? ? ?Filed Weights  ? 01/16/22 1733  ?Weight: 59 kg  ? ? ?Examination:  ?General exam: Appears calm, chronically ill-appearing ?Respiratory system: Clear to auscultation bilaterally without wheezing, mild conversational dyspnea, on 2 L oxygen ?Cardiovascular system: S1 & S2 heard, RRR. No murmurs. No pedal edema. ?Gastrointestinal system: Abdomen is nondistended, soft and nontender. Normal bowel sounds heard. ?Central  nervous system: Alert and oriented. No focal neurological deficits. Speech clear.  ?Extremities: Symmetric in appearance, no erythema or swelling of LE extremities, not as tender to palpation on her left leg compared to examination yesterday ?Skin: No rashes, lesions or ulcers on exposed skin  ?Psychiatry: Judgement and insight appear normal. Mood & affect appropriate.  ? ?Data Reviewed: I have  personally reviewed following labs and imaging studies ? ?CBC: ?Recent Labs  ?Lab 01/16/22 ?1750 01/17/22 ?0326  ?WBC 4.9 3.6*  ?NEUTROABS  --  3.2  ?HGB 13.3 12.5  ?HCT 41.3 39.2  ?MCV 88.4 87.7  ?PLT 146* 122*  ? ? ?Basic Metabolic Panel: ?Recent Labs  ?Lab 01/16/22 ?1750 01/17/22 ?0326  ?NA 140 140  ?K 3.8 3.6  ?CL 108 108  ?CO2 24 23  ?GLUCOSE 110* 139*  ?BUN 18 20  ?CREATININE 0.88 0.95  ?CALCIUM 8.8* 8.8*  ?MG  --  1.8  ? ? ?GFR: ?Estimated Creatinine Clearance: 40.4 mL/min (by C-G formula based on SCr of 0.95 mg/dL). ?Liver Function Tests: ?Recent Labs  ?Lab 01/17/22 ?0326  ?AST 26  ?ALT 17  ?ALKPHOS 33*  ?BILITOT 0.3  ?PROT 6.6  ?ALBUMIN 3.7  ? ? ?No results for input(s): LIPASE, AMYLASE in the last 168 hours. ?No results for input(s): AMMONIA in the last 168 hours. ?Coagulation Profile: ?No results for input(s): INR, PROTIME in the last 168 hours. ?Cardiac Enzymes: ?No results for input(s): CKTOTAL, CKMB, CKMBINDEX, TROPONINI in the last 168 hours. ?BNP (last 3 results) ?No results for input(s): PROBNP in the last 8760 hours. ?HbA1C: ?No results for input(s): HGBA1C in the last 72 hours. ?CBG: ?No results for input(s): GLUCAP in the last 168 hours. ?Lipid Profile: ?No results for input(s): CHOL, HDL, LDLCALC, TRIG, CHOLHDL, LDLDIRECT in the last 72 hours. ?Thyroid Function Tests: ?No results for input(s): TSH, T4TOTAL, FREET4, T3FREE, THYROIDAB in the last 72 hours. ?Anemia Panel: ?No results for input(s): VITAMINB12, FOLATE, FERRITIN, TIBC, IRON, RETICCTPCT in the last 72 hours. ?Sepsis Labs: ?No results for input(s): PROCALCITON, LATICACIDVEN in the last 168 hours. ? ?No results found for this or any previous visit (from the past 240 hour(s)).  ? ? ?Radiology Studies: ?DG HIP UNILAT WITH PELVIS 2-3 VIEWS LEFT ? ?Result Date: 01/20/2022 ?CLINICAL DATA:  Left groin pain EXAM: DG HIP (WITH OR WITHOUT PELVIS) 2V LEFT COMPARISON:  None. FINDINGS: There is no evidence of hip fracture or dislocation. Mild  degenerative changes of the bilateral hips. Moderate degenerative changes of the partially visualized lumbar spine. IMPRESSION: No acute osseous abnormality. Electronically Signed   By: Yetta Glassman M.D.   On: 01/20/2022 10:31   ? ? ? ?Scheduled Meds: ? azithromycin  500 mg Oral Daily  ? bisoprolol  10 mg Oral Daily  ? fenofibrate  54 mg Oral Daily  ? heparin  5,000 Units Subcutaneous Q8H  ? hydrochlorothiazide  6.25 mg Oral Daily  ? ipratropium-albuterol  3 mL Nebulization TID  ? levothyroxine  300 mcg Oral Daily  ? mometasone-formoterol  2 puff Inhalation BID  ? nicotine  21 mg Transdermal Daily  ? pantoprazole  40 mg Oral Daily  ? predniSONE  50 mg Oral Q breakfast  ? sertraline  25 mg Oral Daily  ? simvastatin  10 mg Oral Daily  ? ?Continuous Infusions: ? methocarbamol (ROBAXIN) IV 500 mg (01/20/22 1133)  ? ? ? LOS: 5 days  ? ? ? ?Dessa Phi, DO ?[facility] ?01/21/2022, 12:36 PM  ? ?Available via Epic secure chat  7am-7pm ?After these hours, please refer to coverage provider listed on [email or web address] ? ?

## 2022-01-21 NOTE — Progress Notes (Signed)
Patient's PASSR # is [id number]. ? ?Patient has bed offers from [facility] and [facility]. ? ?Edwin Dada, MSW, LCSW ?Transitions of Care  Clinical Social Worker II ?[phone] ? ?

## 2022-01-22 DIAGNOSIS — J441 Chronic obstructive pulmonary disease with (acute) exacerbation: Secondary | ICD-10-CM | POA: Diagnosis not present

## 2022-01-22 NOTE — Progress Notes (Signed)
?PROGRESS NOTE ? ? ? [name]  [id number] DOB: August 19, 1941 DOA: 01/16/2022 ?PCP: Pcp, No  ? ?  ?Brief Narrative:  ?Madeline Branch is a 81 y.o. female with medical history significant of atrial fibrillation, hyperlipidemia, hypertension, hypothyroidism, PTSD, presents to the ED with a chief complaint of shortness of breath.  It was gradual in onset and started 3 days ago.  Became progressively worse over the last 2 days.  She has associated cough with clear and green sputum.  She has no fever, no chest pain.  She does admit to palpitations.  Her dyspnea and palpitations are nonexertional per her report.  Patient is a current smoker 1 pack/day, has been smoking since she was 81 years old. She was admitted for COPD exacerbation.  ? ?New events last 24 hours / Subjective: ?No new complaints today, breathing improved, no complaints of pain at rest today. ? ?Assessment & Plan: ?  ? ?Principal Problem: ?  COPD exacerbation ([place]) ?Active Problems: ?  GERD (gastroesophageal reflux disease) ?  Acute respiratory failure with hypoxia ([place]) ?  Tobacco use disorder ?  Essential hypertension ?  Depression ?  Thyroid disease ?  Hyperlipidemia ?  Demand ischemia ([place]) ? ? ?Acute hypoxemic respiratory failure secondary to COPD exacerbation ?-CXR: Hyperinflated lungs suggesting underlying COPD with coarsened interstitial markings throughout both lungs favored chronic ?-Left rib xray negative for fracture ?-D-dimer negative ?-Continue prednisone, breathing treatment, completed azithromycin x5 days ?-Continue to wean oxygen as tolerated, maintain saturation >88% ? ?Left hip pain ?-X-ray negative for acute fracture ?-Trial Robaxin for spasms ?-Improved ? ?Hypothyroidism ?-Synthroid ? ?HTN ?-Bisoprolol, HCTZ  ? ?Depression ?-Zoloft ? ?HLD ?-Fenofibrate and zocor ? ?Tobacco abuse ?-Cessation counseling  ?-Nicotine patch  ? ?Demand ischemia ?-Flat troponin trend 20, 20 ?-Echo without regional wall motion abnormalities ? ?Generalized  weakness and deconditioning ?-PT recommended SNF placement ? ? ?DVT prophylaxis:  ?heparin injection 5,000 Units Start: 01/17/22 0600 ?SCDs Start: 01/17/22 0021 ? ?Code Status: Full ?Family Communication: None at bedside ?Disposition Plan:  ?Status is: Inpatient ?Remains inpatient appropriate because: Pending SNF placement ? ?Antimicrobials:  ?Anti-infectives (From admission, onward)  ? ? Start     Dose/Rate Route Frequency Ordered Stop  ? 01/18/22 1345  azithromycin (ZITHROMAX) tablet 500 mg       ? 500 mg Oral Daily 01/18/22 1254    ? ?  ? ? ? ?Objective: ?Vitals:  ? 01/21/22 1946 01/22/22 0445 01/22/22 0727 01/22/22 0732  ?BP:  (!) 130/59    ?Pulse: 84 66    ?Resp: 18 18    ?Temp:  98.1 ?F (36.7 ?C)    ?TempSrc:  Oral    ?SpO2: 93% 94% 95% 95%  ?Weight:      ?Height:      ? ?No intake or output data in the 24 hours ending 01/22/22 1110 ? ?Filed Weights  ? 01/16/22 1733  ?Weight: 59 kg  ? ? ?Examination:  ?General exam: Appears calm, chronically ill-appearing ?Respiratory system: Clear to auscultation bilaterally without wheezing, mild conversational dyspnea, on 2 L oxygen ?Cardiovascular system: S1 & S2 heard, RRR. No murmurs. No pedal edema. ?Gastrointestinal system: Abdomen is nondistended, soft and nontender. Normal bowel sounds heard. ?Central nervous system: Alert and oriented. No focal neurological deficits. Speech clear.  ?Extremities: Symmetric in appearance ?Psychiatry: Judgement and insight appear normal. Mood & affect appropriate.  ? ?Data Reviewed: I have personally reviewed following labs and imaging studies ? ?CBC: ?Recent Labs  ?Lab 01/16/22 ?1750 01/17/22 ?0326  ?  WBC 4.9 3.6*  ?NEUTROABS  --  3.2  ?HGB 13.3 12.5  ?HCT 41.3 39.2  ?MCV 88.4 87.7  ?PLT 146* 122*  ? ? ?Basic Metabolic Panel: ?Recent Labs  ?Lab 01/16/22 ?1750 01/17/22 ?0326  ?NA 140 140  ?K 3.8 3.6  ?CL 108 108  ?CO2 24 23  ?GLUCOSE 110* 139*  ?BUN 18 20  ?CREATININE 0.88 0.95  ?CALCIUM 8.8* 8.8*  ?MG  --  1.8  ? ? ?GFR: ?Estimated  Creatinine Clearance: 40.4 mL/min (by C-G formula based on SCr of 0.95 mg/dL). ?Liver Function Tests: ?Recent Labs  ?Lab 01/17/22 ?0326  ?AST 26  ?ALT 17  ?ALKPHOS 33*  ?BILITOT 0.3  ?PROT 6.6  ?ALBUMIN 3.7  ? ? ?No results for input(s): LIPASE, AMYLASE in the last 168 hours. ?No results for input(s): AMMONIA in the last 168 hours. ?Coagulation Profile: ?No results for input(s): INR, PROTIME in the last 168 hours. ?Cardiac Enzymes: ?No results for input(s): CKTOTAL, CKMB, CKMBINDEX, TROPONINI in the last 168 hours. ?BNP (last 3 results) ?No results for input(s): PROBNP in the last 8760 hours. ?HbA1C: ?No results for input(s): HGBA1C in the last 72 hours. ?CBG: ?No results for input(s): GLUCAP in the last 168 hours. ?Lipid Profile: ?No results for input(s): CHOL, HDL, LDLCALC, TRIG, CHOLHDL, LDLDIRECT in the last 72 hours. ?Thyroid Function Tests: ?No results for input(s): TSH, T4TOTAL, FREET4, T3FREE, THYROIDAB in the last 72 hours. ?Anemia Panel: ?No results for input(s): VITAMINB12, FOLATE, FERRITIN, TIBC, IRON, RETICCTPCT in the last 72 hours. ?Sepsis Labs: ?No results for input(s): PROCALCITON, LATICACIDVEN in the last 168 hours. ? ?No results found for this or any previous visit (from the past 240 hour(s)).  ? ? ?Radiology Studies: ?No results found. ? ? ? ?Scheduled Meds: ? azithromycin  500 mg Oral Daily  ? bisoprolol  10 mg Oral Daily  ? fenofibrate  54 mg Oral Daily  ? heparin  5,000 Units Subcutaneous Q8H  ? hydrochlorothiazide  6.25 mg Oral Daily  ? ipratropium-albuterol  3 mL Nebulization TID  ? levothyroxine  300 mcg Oral Daily  ? mometasone-formoterol  2 puff Inhalation BID  ? nicotine  21 mg Transdermal Daily  ? pantoprazole  40 mg Oral Daily  ? predniSONE  40 mg Oral Q breakfast  ? sertraline  25 mg Oral Daily  ? simvastatin  10 mg Oral Daily  ? ?Continuous Infusions: ? methocarbamol (ROBAXIN) IV 500 mg (01/20/22 1133)  ? ? ? LOS: 6 days  ? ? ? ?Dessa Phi, DO ?[facility] ?01/22/2022, 11:10  AM  ? ?Available via Epic secure chat 7am-7pm ?After these hours, please refer to coverage provider listed on [email or web address] ? ?

## 2022-01-23 DIAGNOSIS — J441 Chronic obstructive pulmonary disease with (acute) exacerbation: Secondary | ICD-10-CM | POA: Diagnosis not present

## 2022-01-23 MED ORDER — OXYCODONE HCL 5 MG PO TABS
5.0000 mg | ORAL_TABLET | ORAL | 0 refills | Status: AC | PRN
Start: 2022-01-23 — End: ?

## 2022-01-23 MED ORDER — METHOCARBAMOL 500 MG PO TABS: 500.0000 mg | ORAL_TABLET | Freq: Three times a day (TID) | ORAL | 0 refills | Status: AC | PRN

## 2022-01-23 MED ORDER — PREDNISONE 10 MG PO TABS
ORAL_TABLET | ORAL | 0 refills | Status: AC
Start: 2022-01-23 — End: ?

## 2022-01-23 MED ORDER — IPRATROPIUM-ALBUTEROL 0.5-2.5 (3) MG/3ML IN SOLN
3.0000 mL | Freq: Two times a day (BID) | RESPIRATORY_TRACT | Status: DC
  Administered 2022-01-23 – 2022-01-24 (×2): 3 mL via RESPIRATORY_TRACT
  Filled 2022-01-23 (×2): qty 3

## 2022-01-23 NOTE — Discharge Summary (Addendum)
Physician Discharge Summary  [name]:[id number] DOB: Mar 10, 1941 DOA: 01/16/2022 ? ?PCP: Pcp, No ? ?Admit date: 01/16/2022 ?Discharge date: 01/24/2022 ? ?Admitted From: Home ?Disposition:  SNF ? ?Recommendations for Outpatient Follow-up:  ?Follow up with PCP in 1 week ? ?Discharge Condition: Stable ?CODE STATUS: Full  ?Diet recommendation:  ?Diet Orders (From admission, onward)  ? ?  Start     Ordered  ? 01/23/22 0000  Diet - low sodium heart healthy       ? 01/23/22 0821  ? 01/16/22 2322  Diet Heart Room service appropriate? Yes; Fluid consistency: Thin  Diet effective now       ?Question Answer Comment  ?Room service appropriate? Yes   ?Fluid consistency: Thin   ?  ? 01/16/22 2321  ? ?  ?  ? ?  ? ?Brief/Interim Summary: ?Madeline Branch is a 81 y.o. female with medical history significant of atrial fibrillation, hyperlipidemia, hypertension, hypothyroidism, PTSD, presents to the ED with a chief complaint of shortness of breath.  It was gradual in onset and started 3 days ago.  Became progressively worse over the last 2 days.  She has associated cough with clear and green sputum.  She has no fever, no chest pain.  She does admit to palpitations.  Her dyspnea and palpitations are nonexertional per her report.  Patient is a current smoker 1 pack/day, has been smoking since she was 81 years old. She was admitted for COPD exacerbation. She required [place] O2, solumedrol which was transitioned to prednisone, azithromycin and breathing treatments. PT recommended SNF on discharge.  ? ?Discharge Diagnoses:  ? ?Principal Problem: ?  COPD exacerbation ([facility]) ?Active Problems: ?  GERD (gastroesophageal reflux disease) ?  Acute respiratory failure with hypoxia ([facility]) ?  Tobacco use disorder ?  Essential hypertension ?  Depression ?  Thyroid disease ?  Hyperlipidemia ?  Demand ischemia ([facility]) ? ? ?Acute hypoxemic respiratory failure secondary to COPD exacerbation ?-CXR: Hyperinflated lungs suggesting underlying COPD with coarsened  interstitial markings throughout both lungs favored chronic ?-Left rib xray negative for fracture ?-D-dimer negative ?-Continue prednisone taper, breathing treatment, completed azithromycin x5 days ?-Weaned to room air, maintain saturation >88% ?  ?Left hip pain ?-X-ray negative for acute fracture ?-Robaxin PRN for spasms ?-Improved ?  ?Hypothyroidism ?-Synthroid ?  ?HTN ?-Bisoprolol, HCTZ  ?  ?Depression ?-Zoloft ?  ?HLD ?-Fenofibrate and zocor ?  ?Tobacco abuse ?-Cessation counseling  ?-Nicotine patch  ?  ?Demand ischemia ?-Flat troponin trend 20, 20 ?-Echo without regional wall motion abnormalities ?  ?Generalized weakness and deconditioning ?-PT recommended SNF placement ?  ? ?Discharge Instructions ? ?Discharge Instructions   ? ? Diet - low sodium heart healthy   Complete by: As directed ?  ? Increase activity slowly   Complete by: As directed ?  ? Increase activity slowly   Complete by: As directed ?  ? ?  ? ?Allergies as of 01/24/2022   ? ?   Reactions  ? Asa [aspirin] Anaphylaxis  ? Irritates stomach and gets ringing in her ears  ? Penicillins Itching  ? ?  ? ?  ?Medication List  ?  ? ?STOP taking these medications   ? ?dicyclomine 10 MG capsule ?Commonly known as: BENTYL ?  ?pantoprazole 40 MG tablet ?Commonly known as: PROTONIX ?  ? ?  ? ?TAKE these medications   ? ?bisoprolol-hydrochlorothiazide 10-6.25 MG tablet ?Commonly known as: ZIAC ?Take 1 tablet by mouth daily. ?  ?diphenoxylate-atropine 2.5-0.025 MG tablet ?Commonly  known as: LOMOTIL ?Take by mouth 4 (four) times daily as needed for diarrhea or loose stools. As directed for diarrhea ?  ?fenofibrate 145 MG tablet ?Commonly known as: TRICOR ?Take 145 mg by mouth daily. ?  ?levothyroxine 75 MCG tablet ?Commonly known as: SYNTHROID ?Take 75 mcg by mouth daily. ?  ?levothyroxine 300 MCG tablet ?Commonly known as: SYNTHROID ?Take 300 mcg by mouth daily. ?  ?methocarbamol 500 MG tablet ?Commonly known as: Robaxin ?Take 1 tablet (500 mg total) by mouth  every 8 (eight) hours as needed for muscle spasms. ?  ?oxyCODONE 5 MG immediate release tablet ?Commonly known as: Oxy IR/ROXICODONE ?Take 1 tablet (5 mg total) by mouth every 4 (four) hours as needed for moderate pain, severe pain or breakthrough pain. ?  ?predniSONE 10 MG tablet ?Commonly known as: DELTASONE ?Take 4 tabs for 3 days, then 3 tabs for 3 days, then 2 tabs for 3 days, then 1 tab for 3 days, then 1/2 tab for 4 days. ?  ?sertraline 25 MG tablet ?Commonly known as: ZOLOFT ?Take 25 mg by mouth daily. ?  ?simvastatin 10 MG tablet ?Commonly known as: ZOCOR ?Take 10 mg by mouth daily. ?  ?Vitamin D (Ergocalciferol) 1.25 MG (50000 UNIT) Caps capsule ?Commonly known as: DRISDOL ?Take 50,000 Units by mouth once a week. ?  ? ?  ? ? Contact information for after-discharge care   ? ? Destination   ? ? [facility] .   ?Service: Skilled Nursing ?Contact information: ?[address] ?[place] ?[phone] ? ?  ?  ? ?  ?  ? ?  ?  ? ?  ? ?Allergies  ?Allergen Reactions  ? Asa [Aspirin] Anaphylaxis  ?  Irritates stomach and gets ringing in her ears  ? Penicillins Itching  ? ? ? ?Procedures/Studies: ?DG Chest 2 View ? ?Result Date: 01/16/2022 ?CLINICAL DATA:  Shortness of breath for 2 days, cough productive of green sputum. EXAM: CHEST - 2 VIEW COMPARISON:  Chest radiograph 04/27/2005 FINDINGS: The cardiomediastinal silhouette is within normal limits The lungs hyperinflated with flattening of the diaphragms suggestive of underlying COPD. There are coarsened interstitial markings throughout both lungs, likely chronic in nature. There is no focal airspace disease. There is no pulmonary edema. There is no pleural effusion or pneumothorax. There is no acute osseous abnormality. IMPRESSION: 1. Hyperinflated lungs suggesting underlying COPD with coarsened interstitial markings throughout both lungs favored chronic. 2. No focal airspace disease or pleural effusion.  Electronically Signed   By: Valetta Mole M.D.   On: 01/16/2022 18:18  ? ?DG Ribs Unilateral Left ? ?Result Date: 01/18/2022 ?CLINICAL DATA:  Left sided chest pain radiating like a band around to back. EXAM: LEFT RIBS - 2 VIEW COMPARISON:  Chest x-ray January 16, 2022. FINDINGS: No evidence a displaced left-sided rib fracture. Please see recent chest x-ray for characterization of the lungs. IMPRESSION: No evidence a displaced left-sided rib fracture. Electronically Signed   By: Margaretha Sheffield M.D.   On: 01/18/2022 11:39  ? ?ECHOCARDIOGRAM COMPLETE ? ?Result Date: 01/17/2022 ?   ECHOCARDIOGRAM REPORT   Patient Name:   Madeline Branch Date of Exam: 01/17/2022 Medical Rec #:  [id number]     Height:       63.0 in Accession #:    [id number]    Weight:       130.0 lb Date of Birth:  1940/10/09      BSA:  1.610 m? Patient Age:    59 years      BP:           189/77 mmHg Patient Gender: F             HR:           73 bpm. Exam Location:  [facility] Procedure: 2D Echo, Cardiac Doppler and Color Doppler Indications:    Elevated Troponin  History:        Patient has no prior history of Echocardiogram examinations.                 COPD; Risk Factors:Hypertension, Dyslipidemia and Current                 Smoker. Images by Lonn Georgia, student.  Sonographer:    Wenda Low Referring Phys: [place] Comments: Image acquisition challenging due to respiratory motion. IMPRESSIONS  1. Left ventricular ejection fraction, by estimation, is 70 to 75%. The left ventricle has normal function. The left ventricle has no regional wall motion abnormalities. Left ventricular diastolic parameters are consistent with Grade I diastolic dysfunction (impaired relaxation).  2. Right ventricular systolic function is normal. The right ventricular size is normal. There is moderately elevated pulmonary artery systolic pressure.  3. The mitral valve is normal in structure. No evidence of mitral valve regurgitation. No evidence of mitral  stenosis.  4. The tricuspid valve is abnormal.  5. The aortic valve was not well visualized. Aortic valve regurgitation is not visualized. No aortic stenosis is present.  6. The inferior vena cava is normal in

## 2022-01-23 NOTE — Progress Notes (Signed)
?PROGRESS NOTE ? ? ? Madeline Branch  BCW:[id number] DOB: 1941-09-08 DOA: 01/16/2022 ?PCP: Pcp, No  ? ?  ?Brief Narrative:  ?Madeline Branch is a 81 y.o. female with medical history significant of atrial fibrillation, hyperlipidemia, hypertension, hypothyroidism, PTSD, presents to the ED with a chief complaint of shortness of breath.  It was gradual in onset and started 3 days ago.  Became progressively worse over the last 2 days.  She has associated cough with clear and green sputum.  She has no fever, no chest pain.  She does admit to palpitations.  Her dyspnea and palpitations are nonexertional per her report.  Patient is a current smoker 1 pack/day, has been smoking since she was 81 years old. She was admitted for COPD exacerbation.  ? ?New events last 24 hours / Subjective: ?No new complaints or issues. Awaiting SNF placement.  ? ?Assessment & Plan: ?  ? ?Principal Problem: ?  COPD exacerbation (HCC) ?Active Problems: ?  GERD (gastroesophageal reflux disease) ?  Acute respiratory failure with hypoxia (HCC) ?  Tobacco use disorder ?  Essential hypertension ?  Depression ?  Thyroid disease ?  Hyperlipidemia ?  Demand ischemia (HCC) ? ? ?Acute hypoxemic respiratory failure secondary to COPD exacerbation ?-CXR: Hyperinflated lungs suggesting underlying COPD with coarsened interstitial markings throughout both lungs favored chronic ?-Left rib xray negative for fracture ?-D-dimer negative ?-Continue prednisone, breathing treatment, completed azithromycin x5 days ?-Continue to wean oxygen as tolerated, maintain saturation >88% ? ?Left hip pain ?-X-ray negative for acute fracture ?-Trial Robaxin for spasms ?-Improved ? ?Hypothyroidism ?-Synthroid ? ?HTN ?-Bisoprolol, HCTZ  ? ?Depression ?-Zoloft ? ?HLD ?-Fenofibrate and zocor ? ?Tobacco abuse ?-Cessation counseling  ?-Nicotine patch  ? ?Demand ischemia ?-Flat troponin trend 20, 20 ?-Echo without regional wall motion abnormalities ? ?Generalized weakness and  deconditioning ?-PT recommended SNF placement ? ? ?DVT prophylaxis:  ?heparin injection 5,000 Units Start: 01/17/22 0600 ?SCDs Start: 01/17/22 0021 ? ?Code Status: Full ?Family Communication: None at bedside ?Disposition Plan:  ?Status is: Inpatient ?Remains inpatient appropriate because: Pending SNF placement ? ?Antimicrobials:  ?Anti-infectives (From admission, onward)  ? ? Start     Dose/Rate Route Frequency Ordered Stop  ? 01/18/22 1345  azithromycin (ZITHROMAX) tablet 500 mg  Status:  Discontinued       ? 500 mg Oral Daily 01/18/22 1254 01/22/22 1112  ? ?  ? ? ? ?Objective: ?Vitals:  ? 01/22/22 2028 01/23/22 0432 01/23/22 0710 01/23/22 0931  ?BP: 136/60 127/61    ?Pulse: 73 62  71  ?Resp: 20 16    ?Temp:  98.6 ?F (37 ?C)    ?TempSrc: Oral     ?SpO2:  94% 95% 92%  ?Weight:      ?Height:      ? ? ?Intake/Output Summary (Last 24 hours) at 01/23/2022 1151 ?Last data filed at 01/23/2022 0500 ?Gross per 24 hour  ?Intake 240 ml  ?Output 700 ml  ?Net -460 ml  ? ? ?Filed Weights  ? 01/16/22 1733  ?Weight: 59 kg  ? ? ?Examination:  ?General exam: Appears calm, chronically ill-appearing ?Respiratory system: Clear to auscultation bilaterally without wheezing, mild conversational dyspnea, on 2 L oxygen ?Cardiovascular system: S1 & S2 heard, RRR. No murmurs. No pedal edema. ?Gastrointestinal system: Abdomen is nondistended, soft and nontender. Normal bowel sounds heard. ?Central nervous system: Alert and oriented. No focal neurological deficits. Speech clear.  ?Extremities: Symmetric in appearance ?Psychiatry: Judgement and insight appear normal. Mood & affect appropriate.  ? ?Data  Reviewed: I have personally reviewed following labs and imaging studies ? ?CBC: ?Recent Labs  ?Lab 01/16/22 ?1750 01/17/22 ?0326  ?WBC 4.9 3.6*  ?NEUTROABS  --  3.2  ?HGB 13.3 12.5  ?HCT 41.3 39.2  ?MCV 88.4 87.7  ?PLT 146* 122*  ? ? ?Basic Metabolic Panel: ?Recent Labs  ?Lab 01/16/22 ?1750 01/17/22 ?0326  ?NA 140 140  ?K 3.8 3.6  ?CL 108 108  ?CO2  24 23  ?GLUCOSE 110* 139*  ?BUN 18 20  ?CREATININE 0.88 0.95  ?CALCIUM 8.8* 8.8*  ?MG  --  1.8  ? ? ?GFR: ?Estimated Creatinine Clearance: 40.4 mL/min (by C-G formula based on SCr of 0.95 mg/dL). ?Liver Function Tests: ?Recent Labs  ?Lab 01/17/22 ?0326  ?AST 26  ?ALT 17  ?ALKPHOS 33*  ?BILITOT 0.3  ?PROT 6.6  ?ALBUMIN 3.7  ? ? ?No results for input(s): LIPASE, AMYLASE in the last 168 hours. ?No results for input(s): AMMONIA in the last 168 hours. ?Coagulation Profile: ?No results for input(s): INR, PROTIME in the last 168 hours. ?Cardiac Enzymes: ?No results for input(s): CKTOTAL, CKMB, CKMBINDEX, TROPONINI in the last 168 hours. ?BNP (last 3 results) ?No results for input(s): PROBNP in the last 8760 hours. ?HbA1C: ?No results for input(s): HGBA1C in the last 72 hours. ?CBG: ?No results for input(s): GLUCAP in the last 168 hours. ?Lipid Profile: ?No results for input(s): CHOL, HDL, LDLCALC, TRIG, CHOLHDL, LDLDIRECT in the last 72 hours. ?Thyroid Function Tests: ?No results for input(s): TSH, T4TOTAL, FREET4, T3FREE, THYROIDAB in the last 72 hours. ?Anemia Panel: ?No results for input(s): VITAMINB12, FOLATE, FERRITIN, TIBC, IRON, RETICCTPCT in the last 72 hours. ?Sepsis Labs: ?No results for input(s): PROCALCITON, LATICACIDVEN in the last 168 hours. ? ?No results found for this or any previous visit (from the past 240 hour(s)).  ? ? ?Radiology Studies: ?No results found. ? ? ? ?Scheduled Meds: ? bisoprolol  10 mg Oral Daily  ? fenofibrate  54 mg Oral Daily  ? heparin  5,000 Units Subcutaneous Q8H  ? hydrochlorothiazide  6.25 mg Oral Daily  ? ipratropium-albuterol  3 mL Nebulization BID  ? levothyroxine  300 mcg Oral Daily  ? mometasone-formoterol  2 puff Inhalation BID  ? nicotine  21 mg Transdermal Daily  ? pantoprazole  40 mg Oral Daily  ? predniSONE  40 mg Oral Q breakfast  ? sertraline  25 mg Oral Daily  ? simvastatin  10 mg Oral Daily  ? ?Continuous Infusions: ? methocarbamol (ROBAXIN) IV 500 mg (01/20/22 1133)   ? ? ? LOS: 7 days  ? ? ? ?Dessa Phi, DO ?[facility] ?01/23/2022, 11:51 AM  ? ?Available via Epic secure chat 7am-7pm ?After these hours, please refer to coverage provider listed on [email or web address] ? ?

## 2022-01-23 NOTE — TOC Progression Note (Addendum)
Transition of Care (TOC) - Progression Note  ? ? ?Patient Details  ?Name: Madeline Branch ?MRN: [id number] ?Date of Birth: Aug 07, 1941 ? ?Transition of Care (TOC) CM/SW Contact  ?Villa Herb, LCSWA ?Phone Number: ?01/23/2022, 12:56 PM ? ?Clinical Narrative:    ?CSW spoke with pt who states she would like to accept the bed offer at [facility]. Insurance Berkley Harvey has been updated.  ? ?CSW updated that insurance Berkley Harvey has been approved. CSW spoke to [place] with [facility] who states that they cannot take pt today but should be able to take her tomorrow. TOC to follow.  ? ?Expected Discharge Plan: Skilled Nursing Facility ?Barriers to Discharge: Continued Medical Work up ? ?Expected Discharge Plan and Services ?Expected Discharge Plan: Skilled Nursing Facility ?In-house Referral: Clinical Social Work ?  ?  ?Living arrangements for the past 2 months: Single Family Home ?                ?  ?  ?  ?  ?  ?  ?  ?  ?  ?  ? ? ?Social Determinants of Health (SDOH) Interventions ?  ? ?Readmission Risk Interventions ?   ? View : No data to display.  ?  ?  ?  ? ? ?

## 2022-01-23 NOTE — Progress Notes (Signed)
Physical Therapy Treatment ?Patient Details ?Name: Madeline Branch ?MRN: [id number] ?DOB: [birth year] ?Today's Date: 01/23/2022 ? ? ?History of Present Illness Madeline Branch is a 81 y.o. female with medical history significant of atrial fibrillation, hyperlipidemia, hypertension, hypothyroidism, PTSD, presents to the ED with a chief complaint of shortness of breath.  It was gradual in onset and started 3 days ago.  Became progressively worse over the last 2 days.  She has associated cough with clear and green sputum.  She has no fever, no chest pain.  She does admit to palpitations.  Her dyspnea and palpitations are nonexertional per her report.  She reports associated stomach muscle wall soreness from coughing.  She describes a pressure sensation in her chest.  Laying down makes that sensation worse.  Sitting up makes it better.  Patient is a current smoker 1 pack/day.  Patient has no known history of COPD.  She has no other complaints at this time. ? ?  ?PT Comments  ? ? Patient demonstrates slow labored movement for sitting up at bedside with difficulty moving LLE due to increasing hip pain with radiation down to ankle, very unsteady on feet with frequent buckling of LLE, able to take a few side steps, steps forward/backwards at bedside before having to sit due to fatigue and left hip pain.  Patient tolerated sitting up in chair after therapy with c/o mild substernal chest pain, "indigestion," per patient, vitals WNL - RN notified.  Patient will benefit from continued skilled physical therapy in hospital and recommended venue below to increase strength, balance, endurance for safe ADLs and gait.  ? ?   ?Recommendations for follow up therapy are one component of a multi-disciplinary discharge planning process, led by the attending physician.  Recommendations may be updated based on patient status, additional functional criteria and insurance authorization. ? ?Follow Up Recommendations ? Skilled nursing-short term rehab  (<3 hours/day) ?  ?  ?Assistance Recommended at Discharge    ?Patient can return home with the following A lot of help with walking and/or transfers;A lot of help with bathing/dressing/bathroom;Assistance with cooking/housework;Assist for transportation;Help with stairs or ramp for entrance ?  ?Equipment Recommendations ? None recommended by PT  ?  ?Recommendations for Other Services   ? ? ?  ?Precautions / Restrictions Precautions ?Precautions: Fall ?Restrictions ?Weight Bearing Restrictions: No  ?  ? ?Mobility ? Bed Mobility ?Overal bed mobility: Needs Assistance ?Bed Mobility: Supine to Sit ?  ?  ?Supine to sit: Min assist, Mod assist ?  ?  ?General bed mobility comments: slow labored movement with diffiuclty moving LLE due to increased hip pain ?  ? ?Transfers ?Overall transfer level: Needs assistance ?Equipment used: Rolling walker (2 wheels) ?Transfers: Bed to chair/wheelchair/BSC, Sit to/from Stand ?Sit to Stand: Min assist ?  ?Step pivot transfers: Min assist, Mod assist ?  ?  ?  ?General transfer comment: labored movment with poor tolerance for weightbearing on LLE due to hip pain ?  ? ?Ambulation/Gait ?Ambulation/Gait assistance: Mod assist ?Gait Distance (Feet): 12 Feet ?Assistive device: Rolling walker (2 wheels) ?Gait Pattern/deviations: Decreased step length - right, Decreased step length - left, Decreased stride length ?Gait velocity: decreased ?  ?  ?General Gait Details: increased endurance/distance for taking side steps and steps forward/backwards at bedside with frequent buckling of LLE due to hip pain, poor standing balance ? ? ?Stairs ?  ?  ?  ?  ?  ? ? ?Wheelchair Mobility ?  ? ?Modified Rankin (Stroke Patients Only) ?  ? ? ?  ?  Balance Overall balance assessment: Needs assistance ?Sitting-balance support: Feet supported, No upper extremity supported ?Sitting balance-Leahy Scale: Fair ?Sitting balance - Comments: fair/good seated at EOB ?  ?Standing balance support: During functional activity,  Bilateral upper extremity supported ?Standing balance-Leahy Scale: Poor ?Standing balance comment: using RW ?  ?  ?  ?  ?  ?  ?  ?  ?  ?  ?  ?  ? ?  ?Cognition Arousal/Alertness: Awake/alert ?Behavior During Therapy: [facility] for tasks assessed/performed ?Overall Cognitive Status: Within Functional Limits for tasks assessed ?  ?  ?  ?  ?  ?  ?  ?  ?  ?  ?  ?  ?  ?  ?  ?  ?  ?  ?  ? ?  ?Exercises General Exercises - Lower Extremity ?Long Arc Quad: Seated, AROM, AAROM, Strengthening, 10 reps, Both ?Hip Flexion/Marching: Seated, AROM, AAROM, Both, 10 reps ?Toe Raises: AROM, Seated, Strengthening, Both, 10 reps ?Heel Raises: Seated, AROM, Strengthening, Both, 10 reps ? ?  ?General Comments   ?  ?  ? ?Pertinent Vitals/Pain Pain Assessment ?Pain Assessment: 0-10 ?Pain Score: 7  ?Pain Location: left hip with radiation down to ankle ?Pain Descriptors / Indicators: Grimacing, Guarding, Aching, Radiating ?Pain Intervention(s): Limited activity within patient's tolerance, Monitored during session, Repositioned  ? ? ?Home Living   ?  ?  ?  ?  ?  ?  ?  ?  ?  ?   ?  ?Prior Function    ?  ?  ?   ? ?PT Goals (current goals can now be found in the care plan section) Acute Rehab PT Goals ?Patient Stated Goal: return home ?PT Goal Formulation: With patient ?Time For Goal Achievement: 02/03/22 ?Potential to Achieve Goals: Good ?Progress towards PT goals: Progressing toward goals ? ?  ?Frequency ? ? ? Min 3X/week ? ? ? ?  ?PT Plan Current plan remains appropriate  ? ? ?Co-evaluation   ?  ?  ?  ?  ? ?  ?AM-PAC PT "6 Clicks" Mobility   ?Outcome Measure ? Help needed turning from your back to your side while in a flat bed without using bedrails?: A Little ?Help needed moving from lying on your back to sitting on the side of a flat bed without using bedrails?: A Lot ?Help needed moving to and from a bed to a chair (including a wheelchair)?: A Lot ?Help needed standing up from a chair using your arms (e.g., wheelchair or bedside chair)?: A  Lot ?Help needed to walk in hospital room?: A Lot ?Help needed climbing 3-5 steps with a railing? : A Lot ?6 Click Score: 13 ? ?  ?End of Session   ?Activity Tolerance: Patient tolerated treatment well;Patient limited by fatigue ?Patient left: in chair;with call bell/phone within reach;with chair alarm set ?Nurse Communication: Mobility status ?PT Visit Diagnosis: Unsteadiness on feet (R26.81);Other abnormalities of gait and mobility (R26.89);Muscle weakness (generalized) (M62.81);Pain ?Pain - Right/Left: Left ?Pain - part of body: Hip;Leg;Knee ?  ? ? ?Time: 7510-2585 ?PT Time Calculation (min) (ACUTE ONLY): 23 min ? ?Charges:  $Therapeutic Exercise: 8-22 mins ?$Therapeutic Activity: 8-22 mins          ?          ? ?11:29 AM, 01/23/22 ?Ocie Bob, MPT ?Physical Therapist with [facility] ?[facility] ?[phone] office ?[phone] mobile phone ? ? ?

## 2022-01-24 NOTE — TOC Transition Note (Signed)
Transition of Care (TOC) - CM/SW Discharge Note ? ? ?Patient Details  ?Name: Madeline Branch ?MRN: [id number] ?Date of Birth: April 10, 1941 ? ?Transition of Care (TOC) CM/SW Contact:  ?Karn Cassis, LCSW ?Phone Number: ?01/24/2022, 10:42 AM ? ? ?Clinical Narrative:  Pt d/c today to [facility]. Pt and facility aware and agreeable. Authorization received. Pt will transport via [facility] once bed is ready at SNF. D/C summary sent to SNF. RN given number to call report.  ? ? ? ?Final next level of care: Skilled Nursing Facility ?Barriers to Discharge: Barriers Resolved ? ? ?Patient Goals and CMS Choice ?Patient states their goals for this hospitalization and ongoing recovery are:: Get better ?CMS Medicare.gov Compare Post Acute Care list provided to:: Patient ?Choice offered to / list presented to : Patient ? ?Discharge Placement ?PASRR number recieved: 01/24/22 ?           ?Patient chooses bed at: Other - please specify in the comment section below: [facility]) ?Patient to be transferred to facility by: [facility] ?Name of family member notified: pt only ?Patient and family notified of of transfer: 01/24/22 ? ?Discharge Plan and Services ?In-house Referral: Clinical Social Work ?  ?           ?  ?  ?  ?  ?  ?  ?  ?  ?  ?  ? ?Social Determinants of Health (SDOH) Interventions ?  ? ? ?Readmission Risk Interventions ?   ? View : No data to display.  ?  ?  ?  ? ? ? ? ? ?

## 2022-01-24 NOTE — Care Management Important Message (Signed)
Important Message ? ?Patient Details  ?Name: Madeline Branch ?MRN: [id number] ?Date of Birth: 1941-04-19 ? ? ?Medicare Important Message Given:  Yes ? ? ? ? ?Corey Harold ?01/24/2022, 12:38 PM ?

## 2023-06-19 ENCOUNTER — Other Ambulatory Visit (HOSPITAL_COMMUNITY): Payer: Self-pay | Admitting: Family Medicine

## 2023-06-19 DIAGNOSIS — Z1231 Encounter for screening mammogram for malignant neoplasm of breast: Secondary | ICD-10-CM

## 2023-06-19 DIAGNOSIS — Z13820 Encounter for screening for osteoporosis: Secondary | ICD-10-CM

## 2023-06-21 ENCOUNTER — Encounter (HOSPITAL_COMMUNITY): Payer: Self-pay

## 2023-06-21 ENCOUNTER — Ambulatory Visit (HOSPITAL_COMMUNITY)
Admission: RE | Admit: 2023-06-21 | Discharge: 2023-06-21 | Disposition: A | Discharge status: Home / Self Care | Payer: 59 | Source: Ambulatory Visit | Attending: Family Medicine | Admitting: Family Medicine

## 2023-06-21 DIAGNOSIS — M810 Age-related osteoporosis without current pathological fracture: Secondary | ICD-10-CM | POA: Insufficient documentation

## 2023-06-21 DIAGNOSIS — Z780 Asymptomatic menopausal state: Secondary | ICD-10-CM | POA: Insufficient documentation

## 2023-06-21 DIAGNOSIS — Z13820 Encounter for screening for osteoporosis: Secondary | ICD-10-CM | POA: Diagnosis present

## 2023-06-21 DIAGNOSIS — Z1231 Encounter for screening mammogram for malignant neoplasm of breast: Secondary | ICD-10-CM | POA: Diagnosis present

## 2023-12-25 ENCOUNTER — Other Ambulatory Visit (HOSPITAL_COMMUNITY): Payer: Self-pay | Admitting: Adult Health

## 2023-12-25 DIAGNOSIS — I10 Essential (primary) hypertension: Secondary | ICD-10-CM

## 2024-02-06 ENCOUNTER — Ambulatory Visit (HOSPITAL_COMMUNITY)
Admission: RE | Admit: 2024-02-06 | Discharge: 2024-02-06 | Disposition: A | Discharge status: Home / Self Care | Source: Ambulatory Visit | Attending: Adult Health | Admitting: Adult Health

## 2024-02-06 ENCOUNTER — Encounter (HOSPITAL_COMMUNITY): Payer: Self-pay

## 2024-02-06 DIAGNOSIS — I10 Essential (primary) hypertension: Secondary | ICD-10-CM | POA: Diagnosis present

## 2024-02-06 MED ORDER — IOHEXOL 300 MG/ML  SOLN: 100.0000 mL | Freq: Once | INTRAMUSCULAR | Status: DC | PRN

## 2024-02-06 MED ORDER — IOHEXOL 350 MG/ML SOLN
100.0000 mL | Freq: Once | INTRAVENOUS | Status: AC | PRN
  Administered 2024-02-06: 100 mL via INTRAVENOUS
